# Patient Record
Sex: Male | Born: 1970 | Race: White | Hispanic: No | Marital: Married | Smoking: Current every day smoker
Health system: Southern US, Community
[De-identification: ages and names within clinical notes are randomized; demographics above are authoritative.]

## PROBLEM LIST (undated history)

## (undated) DIAGNOSIS — F419 Anxiety disorder, unspecified: Secondary | ICD-10-CM

## (undated) DIAGNOSIS — J45909 Unspecified asthma, uncomplicated: Secondary | ICD-10-CM

## (undated) DIAGNOSIS — I499 Cardiac arrhythmia, unspecified: Secondary | ICD-10-CM

## (undated) DIAGNOSIS — M109 Gout, unspecified: Secondary | ICD-10-CM

## (undated) DIAGNOSIS — R7303 Prediabetes: Secondary | ICD-10-CM

## (undated) DIAGNOSIS — I509 Heart failure, unspecified: Secondary | ICD-10-CM

## (undated) DIAGNOSIS — I82819 Embolism and thrombosis of superficial veins of unspecified lower extremities: Secondary | ICD-10-CM

## (undated) DIAGNOSIS — I4892 Unspecified atrial flutter: Secondary | ICD-10-CM

## (undated) DIAGNOSIS — Z87442 Personal history of urinary calculi: Secondary | ICD-10-CM

## (undated) DIAGNOSIS — N289 Disorder of kidney and ureter, unspecified: Secondary | ICD-10-CM

## (undated) DIAGNOSIS — Z9989 Dependence on other enabling machines and devices: Secondary | ICD-10-CM

## (undated) DIAGNOSIS — G8929 Other chronic pain: Secondary | ICD-10-CM

## (undated) DIAGNOSIS — I82409 Acute embolism and thrombosis of unspecified deep veins of unspecified lower extremity: Secondary | ICD-10-CM

## (undated) DIAGNOSIS — M549 Dorsalgia, unspecified: Secondary | ICD-10-CM

## (undated) DIAGNOSIS — J449 Chronic obstructive pulmonary disease, unspecified: Secondary | ICD-10-CM

## (undated) DIAGNOSIS — R0609 Other forms of dyspnea: Secondary | ICD-10-CM

## (undated) DIAGNOSIS — M5416 Radiculopathy, lumbar region: Secondary | ICD-10-CM

## (undated) DIAGNOSIS — G4733 Obstructive sleep apnea (adult) (pediatric): Secondary | ICD-10-CM

## (undated) DIAGNOSIS — R0600 Dyspnea, unspecified: Secondary | ICD-10-CM

## (undated) HISTORY — PX: ADENOIDECTOMY: SUR15

## (undated) HISTORY — PX: LITHOTRIPSY: SUR834

## (undated) HISTORY — PX: CYSTOSCOPY/URETEROSCOPY/HOLMIUM LASER/STENT PLACEMENT: SHX6546

## (undated) HISTORY — PX: OTHER SURGICAL HISTORY: SHX169

---

## 2005-05-26 ENCOUNTER — Ambulatory Visit: Payer: Self-pay | Admitting: Family Medicine

## 2007-06-22 ENCOUNTER — Encounter: Payer: Self-pay | Admitting: Family Medicine

## 2008-01-18 ENCOUNTER — Emergency Department (HOSPITAL_COMMUNITY): Admission: EM | Admit: 2008-01-18 | Discharge: 2008-01-18 | Payer: Self-pay | Admitting: Emergency Medicine

## 2009-08-31 ENCOUNTER — Emergency Department (HOSPITAL_COMMUNITY): Admission: EM | Admit: 2009-08-31 | Discharge: 2009-08-31 | Payer: Self-pay | Admitting: Emergency Medicine

## 2009-09-07 ENCOUNTER — Emergency Department (HOSPITAL_COMMUNITY): Admission: EM | Admit: 2009-09-07 | Discharge: 2009-09-07 | Payer: Self-pay | Admitting: Emergency Medicine

## 2010-05-15 ENCOUNTER — Emergency Department (HOSPITAL_COMMUNITY): Admission: EM | Admit: 2010-05-15 | Discharge: 2010-05-15 | Payer: Self-pay | Admitting: Emergency Medicine

## 2010-06-21 DIAGNOSIS — I4892 Unspecified atrial flutter: Secondary | ICD-10-CM

## 2010-06-21 HISTORY — DX: Unspecified atrial flutter: I48.92

## 2010-06-21 HISTORY — PX: OTHER SURGICAL HISTORY: SHX169

## 2010-07-21 NOTE — Letter (Signed)
Summary: rpc chart  rpc chart   Imported By: Curtis Sites 04/06/2010 10:37:27  _____________________________________________________________________  External Attachment:    Type:   Image     Comment:   External Document

## 2011-09-30 ENCOUNTER — Other Ambulatory Visit (HOSPITAL_COMMUNITY): Payer: Self-pay | Admitting: Family Medicine

## 2011-09-30 DIAGNOSIS — M549 Dorsalgia, unspecified: Secondary | ICD-10-CM

## 2011-10-04 ENCOUNTER — Other Ambulatory Visit (HOSPITAL_COMMUNITY): Payer: Self-pay

## 2012-01-26 ENCOUNTER — Other Ambulatory Visit (HOSPITAL_COMMUNITY): Payer: Self-pay | Admitting: Orthopedic Surgery

## 2012-01-26 ENCOUNTER — Ambulatory Visit (HOSPITAL_COMMUNITY)
Admission: RE | Admit: 2012-01-26 | Discharge: 2012-01-26 | Disposition: A | Discharge status: Home / Self Care | Payer: Medicaid Other | Source: Ambulatory Visit | Attending: Orthopedic Surgery | Admitting: Orthopedic Surgery

## 2012-01-26 DIAGNOSIS — M7989 Other specified soft tissue disorders: Secondary | ICD-10-CM | POA: Insufficient documentation

## 2012-01-26 DIAGNOSIS — M79609 Pain in unspecified limb: Secondary | ICD-10-CM | POA: Insufficient documentation

## 2012-06-24 ENCOUNTER — Encounter (HOSPITAL_COMMUNITY): Payer: Self-pay | Admitting: *Deleted

## 2012-06-24 ENCOUNTER — Emergency Department (HOSPITAL_COMMUNITY)
Admission: EM | Admit: 2012-06-24 | Discharge: 2012-06-24 | Disposition: A | Discharge status: Home / Self Care | Payer: Medicaid Other | Attending: Emergency Medicine | Admitting: Emergency Medicine

## 2012-06-24 DIAGNOSIS — Z8659 Personal history of other mental and behavioral disorders: Secondary | ICD-10-CM | POA: Insufficient documentation

## 2012-06-24 DIAGNOSIS — L0390 Cellulitis, unspecified: Secondary | ICD-10-CM

## 2012-06-24 DIAGNOSIS — Z79899 Other long term (current) drug therapy: Secondary | ICD-10-CM | POA: Insufficient documentation

## 2012-06-24 DIAGNOSIS — Z8679 Personal history of other diseases of the circulatory system: Secondary | ICD-10-CM | POA: Insufficient documentation

## 2012-06-24 DIAGNOSIS — L02219 Cutaneous abscess of trunk, unspecified: Secondary | ICD-10-CM | POA: Insufficient documentation

## 2012-06-24 DIAGNOSIS — F1210 Cannabis abuse, uncomplicated: Secondary | ICD-10-CM | POA: Insufficient documentation

## 2012-06-24 DIAGNOSIS — L0291 Cutaneous abscess, unspecified: Secondary | ICD-10-CM

## 2012-06-24 DIAGNOSIS — Z8739 Personal history of other diseases of the musculoskeletal system and connective tissue: Secondary | ICD-10-CM | POA: Insufficient documentation

## 2012-06-24 DIAGNOSIS — F17200 Nicotine dependence, unspecified, uncomplicated: Secondary | ICD-10-CM | POA: Insufficient documentation

## 2012-06-24 HISTORY — DX: Anxiety disorder, unspecified: F41.9

## 2012-06-24 HISTORY — DX: Gout, unspecified: M10.9

## 2012-06-24 HISTORY — DX: Disorder of kidney and ureter, unspecified: N28.9

## 2012-06-24 HISTORY — DX: Unspecified atrial flutter: I48.92

## 2012-06-24 MED ORDER — HYDROCODONE-ACETAMINOPHEN 5-325 MG PO TABS: 1.0000 | ORAL_TABLET | ORAL | Status: AC | PRN

## 2012-06-24 MED ORDER — SULFAMETHOXAZOLE-TRIMETHOPRIM 800-160 MG PO TABS: 1.0000 | ORAL_TABLET | Freq: Two times a day (BID) | ORAL | Status: DC

## 2012-06-24 NOTE — ED Provider Notes (Signed)
History     CSN: [id number]  Arrival date & time 06/24/12  1532   First MD Initiated Contact with Patient 06/24/12 1736      Chief Complaint  Patient presents with  . Abscess    (Consider location/radiation/quality/duration/timing/severity/associated sxs/prior treatment) HPI Comments: Darrell Lindsey is a 42 y.o. Male presents with multiple small areas of skin infection and one large boil on his abdomen and groin area which has burst and drained a large amount of pus.  He reports recent exposure to a family member with known mrsa.  He denies fevers and chills and other complaint,  Except for localized pain and continued drainage from the one boil site.     The history is provided by the patient.    Past Medical History  Diagnosis Date  . Anxiety   . Back pain   . Renal disorder   . Gout   . Atrial flutter     Past Surgical History  Procedure Date  . Cardaic ablasion     No family history on file.  History  Substance Use Topics  . Smoking status: Current Every Day Smoker  . Smokeless tobacco: Not on file  . Alcohol Use: Yes     Comment: rarely      Review of Systems  Constitutional: Negative for fever and chills.  HENT: Negative for facial swelling.   Respiratory: Negative for shortness of breath and wheezing.   Skin: Positive for color change.  Neurological: Negative for numbness.    Allergies  Effexor  Home Medications   Current Outpatient Rx  Name  Route  Sig  Dispense  Refill  . HYDROCODONE-ACETAMINOPHEN 5-325 MG PO TABS   Oral   Take 1 tablet by mouth every 4 (four) hours as needed for pain.   15 tablet   0   . SULFAMETHOXAZOLE-TRIMETHOPRIM 800-160 MG PO TABS   Oral   Take 1 tablet by mouth every 12 (twelve) hours.   28 tablet   0     BP 129/90  Pulse 84  Temp 97.9 F (36.6 C) (Oral)  Resp 16  Ht 6' (1.829 m)  Wt 315 lb (142.883 kg)  BMI 42.72 kg/m2  SpO2 99%  Physical Exam  Constitutional: He appears well-developed and  well-nourished. No distress.  HENT:  Head: Normocephalic.  Neck: Neck supple.  Cardiovascular: Normal rate.   Pulmonary/Chest: Effort normal. He has no wheezes.  Musculoskeletal: Normal range of motion. He exhibits no edema.  Skin:       Several small scattered papules along anterior belt line area of abdomen.  He has a shallow open abscess on suprapubic area without significant surrounding edema,  But with 3 cm halo of erythema.  Shallow base of abscess with healthy appearing granulation noted at base.  No active drainage with palpation.    ED Course  Procedures (including critical care time)  Labs Reviewed - No data to display No results found.   1. Abscess and cellulitis       MDM  Encouraged warm compresses or tub soaks,  Antimicrobial soap.  Prescribed bactrim,  Hydrocodone.  F/u care prn.  No systemic sx,  Patient appears well.        Burgess Amor, PA 06/25/12 1302

## 2012-06-24 NOTE — ED Notes (Signed)
Pt states abscess to suprapubic area. Draining x 2 days. NAD.

## 2012-06-27 NOTE — ED Provider Notes (Signed)
Medical screening examination/treatment/procedure(s) were performed by non-physician practitioner and as supervising physician I was immediately available for consultation/collaboration.   Shelda Jakes, MD 06/27/12 [phone]

## 2012-08-31 ENCOUNTER — Telehealth: Payer: Self-pay | Admitting: Gastroenterology

## 2012-08-31 ENCOUNTER — Ambulatory Visit: Payer: Medicaid Other | Admitting: Gastroenterology

## 2012-08-31 NOTE — Telephone Encounter (Signed)
Pt was a no show

## 2013-10-16 ENCOUNTER — Emergency Department (HOSPITAL_COMMUNITY)
Admission: EM | Admit: 2013-10-16 | Discharge: 2013-10-16 | Disposition: A | Discharge status: Home / Self Care | Payer: Medicaid Other | Attending: Emergency Medicine | Admitting: Emergency Medicine

## 2013-10-16 ENCOUNTER — Encounter (HOSPITAL_COMMUNITY): Payer: Self-pay | Admitting: Emergency Medicine

## 2013-10-16 DIAGNOSIS — M109 Gout, unspecified: Secondary | ICD-10-CM | POA: Insufficient documentation

## 2013-10-16 DIAGNOSIS — Z79899 Other long term (current) drug therapy: Secondary | ICD-10-CM | POA: Insufficient documentation

## 2013-10-16 DIAGNOSIS — F411 Generalized anxiety disorder: Secondary | ICD-10-CM | POA: Insufficient documentation

## 2013-10-16 DIAGNOSIS — I4892 Unspecified atrial flutter: Secondary | ICD-10-CM | POA: Insufficient documentation

## 2013-10-16 DIAGNOSIS — Z8739 Personal history of other diseases of the musculoskeletal system and connective tissue: Secondary | ICD-10-CM | POA: Insufficient documentation

## 2013-10-16 DIAGNOSIS — L0291 Cutaneous abscess, unspecified: Secondary | ICD-10-CM

## 2013-10-16 DIAGNOSIS — E669 Obesity, unspecified: Secondary | ICD-10-CM | POA: Insufficient documentation

## 2013-10-16 DIAGNOSIS — K612 Anorectal abscess: Secondary | ICD-10-CM | POA: Insufficient documentation

## 2013-10-16 DIAGNOSIS — F17200 Nicotine dependence, unspecified, uncomplicated: Secondary | ICD-10-CM | POA: Insufficient documentation

## 2013-10-16 DIAGNOSIS — Z87448 Personal history of other diseases of urinary system: Secondary | ICD-10-CM | POA: Insufficient documentation

## 2013-10-16 DIAGNOSIS — Z7982 Long term (current) use of aspirin: Secondary | ICD-10-CM | POA: Insufficient documentation

## 2013-10-16 MED ORDER — DOXYCYCLINE HYCLATE 100 MG PO CAPS: 100.0000 mg | ORAL_CAPSULE | Freq: Two times a day (BID) | ORAL | Status: DC

## 2013-10-16 MED ORDER — LIDOCAINE-EPINEPHRINE-TETRACAINE (LET) SOLUTION
3.0000 mL | Freq: Once | NASAL | Status: AC
  Administered 2013-10-16: 3 mL via TOPICAL
  Filled 2013-10-16: qty 3

## 2013-10-16 MED ORDER — OXYCODONE-ACETAMINOPHEN 5-325 MG PO TABS
1.0000 | ORAL_TABLET | Freq: Once | ORAL | Status: AC
  Administered 2013-10-16: 1 via ORAL
  Filled 2013-10-16: qty 1

## 2013-10-16 MED ORDER — LIDOCAINE HCL (PF) 1 % IJ SOLN
5.0000 mL | Freq: Once | INTRAMUSCULAR | Status: AC
  Administered 2013-10-16: 5 mL via INTRADERMAL
  Filled 2013-10-16: qty 5

## 2013-10-16 MED ORDER — OXYCODONE-ACETAMINOPHEN 5-325 MG PO TABS
1.0000 | ORAL_TABLET | ORAL | Status: DC | PRN
Start: 2013-10-16 — End: 2014-07-29

## 2013-10-16 NOTE — Discharge Instructions (Signed)

## 2013-10-16 NOTE — ED Provider Notes (Signed)
CSN: [id number]     Arrival date & time 10/16/13  1508 History   First MD Initiated Contact with Patient 10/16/13 1541     Chief Complaint  Patient presents with  . Abscess     (Consider location/radiation/quality/duration/timing/severity/associated sxs/prior Treatment) Patient is a [age] y.o. male presenting with abscess. The history is provided by the patient.  Abscess Location:  Ano-genital Ano-genital abscess location:  Perineum Abscess quality: fluctuance, induration, painful and redness   Abscess quality: not draining and no warmth   Red streaking: no   Duration:  2 days Progression:  Worsening Pain details:    Quality:  Throbbing   Severity:  Moderate   Timing:  Constant   Progression:  Worsening Chronicity:  Recurrent Context: not diabetes and not insect bite/sting   Relieved by:  Nothing Worsened by:  Nothing tried Ineffective treatments:  None tried Associated symptoms: no fever, no nausea and no vomiting   Risk factors: no hx of MRSA     Past Medical History  Diagnosis Date  . Anxiety   . Back pain   . Gout   . Atrial flutter   . Renal disorder    Past Surgical History  Procedure Laterality Date  . Cardaic ablasion    . Adenoidectomy    . Removal of salivary gland      History reviewed. No pertinent family history. History  Substance Use Topics  . Smoking status: Current Every Day Smoker  . Smokeless tobacco: Not on file  . Alcohol Use: Yes     Comment: rarely    Review of Systems  Constitutional: Negative for fever and chills.  Gastrointestinal: Negative for nausea and vomiting.  Musculoskeletal: Negative for arthralgias and joint swelling.  Skin: Positive for color change.       Abscess   Hematological: Negative for adenopathy.  All other systems reviewed and are negative.     Allergies  Effexor  Home Medications   Prior to Admission medications   Medication Sig Start Date End Date Taking? Authorizing Provider  albuterol (PROAIR  HFA) 108 (90 BASE) MCG/ACT inhaler Inhale 1-2 puffs into the lungs every 6 (six) hours as needed for wheezing or shortness of breath.   Yes Historical Provider, MD  allopurinol (ZYLOPRIM) 300 MG tablet Take 300 mg by mouth daily.   Yes Historical Provider, MD  aspirin EC 325 MG tablet Take 325 mg by mouth daily.   Yes Historical Provider, MD  atorvastatin (LIPITOR) 20 MG tablet Take 20 mg by mouth daily.   Yes Historical Provider, MD  clonazePAM (KLONOPIN) 0.5 MG tablet Take 0.5 mg by mouth 2 (two) times daily as needed for anxiety.   Yes Historical Provider, MD  fluticasone (FLONASE) 50 MCG/ACT nasal spray Place 1 spray into both nostrils daily as needed for allergies or rhinitis.   Yes Historical Provider, MD  ibuprofen (ADVIL,MOTRIN) 200 MG tablet Take 800 mg by mouth daily as needed for mild pain or moderate pain.   Yes Historical Provider, MD  lisinopril (PRINIVIL,ZESTRIL) 10 MG tablet Take 10 mg by mouth daily.   Yes Historical Provider, MD  Menthol, Topical Analgesic, (ICY HOT) 5 % PADS Apply 1 patch topically once as needed (for back pain).   Yes Historical Provider, MD  metoprolol succinate (TOPROL-XL) 50 MG 24 hr tablet Take 75 mg by mouth daily. Take with or immediately following a meal.   Yes Historical Provider, MD  nitroGLYCERIN (NITROSTAT) 0.4 MG SL tablet Place 0.4 mg under the tongue every  5 (five) minutes as needed for chest pain.   Yes Historical Provider, MD  traMADol (ULTRAM) 50 MG tablet Take 100 mg by mouth 2 (two) times daily as needed for moderate pain or severe pain.   Yes Historical Provider, MD   BP 155/96  Pulse 64  Temp(Src) 97.8 F (36.6 C) (Oral)  Resp 18  Ht 6' (1.829 m)  Wt 345 lb (156.491 kg)  BMI 46.78 kg/m2  SpO2 100% Physical Exam  Nursing note and vitals reviewed. Constitutional: He is oriented to person, place, and time. He appears well-developed and well-nourished. No distress.  Patient is obese  HENT:  Head: Normocephalic and atraumatic.   Cardiovascular: Normal rate, regular rhythm and normal heart sounds.   No murmur heard. Pulmonary/Chest: Effort normal and breath sounds normal. No respiratory distress.  Neurological: He is alert and oriented to person, place, and time. He exhibits normal muscle tone. Coordination normal.  Skin: Skin is warm and dry. There is erythema.  Abscess to the suprapubic region with moderate fluctuance and surrounding erythema.  No red streaks or drainage    ED Course  Procedures (including critical care time) Labs Review Labs Reviewed - No data to display  Imaging Review No results found.   EKG Interpretation None       INCISION AND DRAINAGE Performed by: Kahmya Pinkham L. Glory Graefe Consent: Verbal consent obtained. Risks and benefits: risks, benefits and alternatives were discussed Type: abscess  Body area: suprapubic region  Anesthesia: local infiltration  Incision was made with a #11 scalpel.  Local anesthetic: lidocaine 1% w/o epinephrine  Anesthetic total: 3 ml  Complexity: complex Blunt dissection to break up loculations  Drainage: purulent  Drainage amount: large  Packing material: 1/4 in iodoform gauze  Patient tolerance: Patient tolerated the procedure well with no immediate complications.     MDM   Final diagnoses:  Abscess    Pt is well appearing, non-toxic.  VSS.  He agrees to return here in 2 days for recheck and packing removal.  rx for doxy and percocet.  Pt appears stable for d/c     Jaylyn Booher L. Trisha Mangleriplett, PA-C 10/18/13 2002

## 2013-10-16 NOTE — ED Notes (Signed)
Pt states he had small bump to perineal area but that over last couple of says, has become more painful. Perineum cellulitic in appearance surrounding original "bump".

## 2013-10-16 NOTE — ED Notes (Signed)
Abscess "in my private area"

## 2013-10-19 NOTE — ED Provider Notes (Signed)
Medical screening examination/treatment/procedure(s) were performed by non-physician practitioner and as supervising physician I was immediately available for consultation/collaboration.    Brantley Naser R Alyda Megna, MD 10/19/13 1543 

## 2014-04-18 ENCOUNTER — Other Ambulatory Visit: Payer: Self-pay | Admitting: Urology

## 2014-04-18 DIAGNOSIS — N2889 Other specified disorders of kidney and ureter: Secondary | ICD-10-CM

## 2014-04-25 ENCOUNTER — Ambulatory Visit
Admission: RE | Admit: 2014-04-25 | Discharge: 2014-04-25 | Disposition: A | Discharge status: Home / Self Care | Payer: Medicaid Other | Source: Ambulatory Visit | Attending: Urology | Admitting: Urology

## 2014-04-25 DIAGNOSIS — N2889 Other specified disorders of kidney and ureter: Secondary | ICD-10-CM

## 2014-04-25 MED ORDER — GADOBENATE DIMEGLUMINE 529 MG/ML IV SOLN
20.0000 mL | Freq: Once | INTRAVENOUS | Status: AC | PRN
Start: 2014-04-25 — End: 2014-04-25

## 2014-07-29 ENCOUNTER — Emergency Department (HOSPITAL_COMMUNITY)
Admission: EM | Admit: 2014-07-29 | Discharge: 2014-07-29 | Disposition: A | Discharge status: Home / Self Care | Payer: Medicaid Other | Attending: Emergency Medicine | Admitting: Emergency Medicine

## 2014-07-29 ENCOUNTER — Ambulatory Visit (HOSPITAL_COMMUNITY): Admit: 2014-07-29 | Payer: Medicaid Other

## 2014-07-29 ENCOUNTER — Emergency Department (HOSPITAL_COMMUNITY): Payer: Medicaid Other

## 2014-07-29 ENCOUNTER — Encounter (HOSPITAL_COMMUNITY): Payer: Self-pay | Admitting: *Deleted

## 2014-07-29 DIAGNOSIS — T798XXA Other early complications of trauma, initial encounter: Secondary | ICD-10-CM

## 2014-07-29 DIAGNOSIS — Y838 Other surgical procedures as the cause of abnormal reaction of the patient, or of later complication, without mention of misadventure at the time of the procedure: Secondary | ICD-10-CM | POA: Diagnosis not present

## 2014-07-29 DIAGNOSIS — M79661 Pain in right lower leg: Secondary | ICD-10-CM

## 2014-07-29 DIAGNOSIS — Z7982 Long term (current) use of aspirin: Secondary | ICD-10-CM | POA: Insufficient documentation

## 2014-07-29 DIAGNOSIS — Z79899 Other long term (current) drug therapy: Secondary | ICD-10-CM | POA: Insufficient documentation

## 2014-07-29 DIAGNOSIS — J449 Chronic obstructive pulmonary disease, unspecified: Secondary | ICD-10-CM | POA: Diagnosis not present

## 2014-07-29 DIAGNOSIS — G4733 Obstructive sleep apnea (adult) (pediatric): Secondary | ICD-10-CM | POA: Diagnosis not present

## 2014-07-29 DIAGNOSIS — Z720 Tobacco use: Secondary | ICD-10-CM | POA: Diagnosis not present

## 2014-07-29 DIAGNOSIS — F419 Anxiety disorder, unspecified: Secondary | ICD-10-CM | POA: Insufficient documentation

## 2014-07-29 DIAGNOSIS — Z9981 Dependence on supplemental oxygen: Secondary | ICD-10-CM | POA: Diagnosis not present

## 2014-07-29 DIAGNOSIS — M7989 Other specified soft tissue disorders: Secondary | ICD-10-CM

## 2014-07-29 DIAGNOSIS — Z7951 Long term (current) use of inhaled steroids: Secondary | ICD-10-CM | POA: Diagnosis not present

## 2014-07-29 DIAGNOSIS — T814XXA Infection following a procedure, initial encounter: Secondary | ICD-10-CM | POA: Diagnosis not present

## 2014-07-29 DIAGNOSIS — Z792 Long term (current) use of antibiotics: Secondary | ICD-10-CM | POA: Diagnosis not present

## 2014-07-29 DIAGNOSIS — G8929 Other chronic pain: Secondary | ICD-10-CM | POA: Insufficient documentation

## 2014-07-29 DIAGNOSIS — Z87448 Personal history of other diseases of urinary system: Secondary | ICD-10-CM | POA: Diagnosis not present

## 2014-07-29 DIAGNOSIS — M109 Gout, unspecified: Secondary | ICD-10-CM | POA: Diagnosis not present

## 2014-07-29 HISTORY — DX: Dorsalgia, unspecified: M54.9

## 2014-07-29 HISTORY — DX: Chronic obstructive pulmonary disease, unspecified: J44.9

## 2014-07-29 HISTORY — DX: Obstructive sleep apnea (adult) (pediatric): G47.33

## 2014-07-29 HISTORY — DX: Other forms of dyspnea: R06.09

## 2014-07-29 HISTORY — DX: Radiculopathy, lumbar region: M54.16

## 2014-07-29 HISTORY — DX: Unspecified asthma, uncomplicated: J45.909

## 2014-07-29 HISTORY — DX: Other chronic pain: G89.29

## 2014-07-29 HISTORY — DX: Dependence on other enabling machines and devices: Z99.89

## 2014-07-29 HISTORY — DX: Dyspnea, unspecified: R06.00

## 2014-07-29 LAB — COMPREHENSIVE METABOLIC PANEL
ALBUMIN: 3.9 g/dL (ref 3.5–5.2)
ALK PHOS: 130 U/L — AB (ref 39–117)
ALT: 20 U/L (ref 0–53)
AST: 22 U/L (ref 0–37)
Anion gap: 5 (ref 5–15)
BUN: 15 mg/dL (ref 6–23)
CO2: 26 mmol/L (ref 19–32)
CREATININE: 1.11 mg/dL (ref 0.50–1.35)
Calcium: 9 mg/dL (ref 8.4–10.5)
Chloride: 112 mmol/L (ref 96–112)
GFR calc non Af Amer: 81 mL/min — ABNORMAL LOW (ref >90)
GLUCOSE: 88 mg/dL (ref 70–99)
Potassium: 3.7 mmol/L (ref 3.5–5.1)
SODIUM: 143 mmol/L (ref 135–145)
TOTAL PROTEIN: 6.9 g/dL (ref 6.0–8.3)
Total Bilirubin: 0.7 mg/dL (ref 0.3–1.2)

## 2014-07-29 LAB — CBC WITH DIFFERENTIAL/PLATELET
BASOS PCT: 1 % (ref 0–1)
Basophils Absolute: 0.1 10*3/uL (ref 0.0–0.1)
EOS ABS: 0.2 10*3/uL (ref 0.0–0.7)
EOS PCT: 2 % (ref 0–5)
HEMATOCRIT: 45.6 % (ref 39.0–52.0)
Hemoglobin: 15.5 g/dL (ref 13.0–17.0)
LYMPHS PCT: 29 % (ref 12–46)
Lymphs Abs: 3.3 10*3/uL (ref 0.7–4.0)
MCH: 30.9 pg (ref 26.0–34.0)
MCHC: 34 g/dL (ref 30.0–36.0)
MCV: 91 fL (ref 78.0–100.0)
MONO ABS: 0.9 10*3/uL (ref 0.1–1.0)
MONOS PCT: 8 % (ref 3–12)
NEUTROS ABS: 7 10*3/uL (ref 1.7–7.7)
Neutrophils Relative %: 60 % (ref 43–77)
Platelets: 218 10*3/uL (ref 150–400)
RBC: 5.01 MIL/uL (ref 4.22–5.81)
RDW: 13.3 % (ref 11.5–15.5)
WBC: 11.5 10*3/uL — ABNORMAL HIGH (ref 4.0–10.5)

## 2014-07-29 MED ORDER — OXYCODONE-ACETAMINOPHEN 5-325 MG PO TABS: 1.0000 | ORAL_TABLET | ORAL | Status: DC | PRN

## 2014-07-29 MED ORDER — CLINDAMYCIN HCL 300 MG PO CAPS: 300.0000 mg | ORAL_CAPSULE | Freq: Four times a day (QID) | ORAL | Status: DC

## 2014-07-29 MED ORDER — RIVAROXABAN 15 MG PO TABS
15.0000 mg | ORAL_TABLET | Freq: Once | ORAL | Status: AC
  Administered 2014-07-29: 15 mg via ORAL
  Filled 2014-07-29: qty 1

## 2014-07-29 MED ORDER — CLINDAMYCIN HCL 150 MG PO CAPS
300.0000 mg | ORAL_CAPSULE | Freq: Once | ORAL | Status: AC
  Administered 2014-07-29: 300 mg via ORAL
  Filled 2014-07-29: qty 2

## 2014-07-29 MED ORDER — RIVAROXABAN 15 MG PO TABS
ORAL_TABLET | ORAL | Status: AC
  Filled 2014-07-29: qty 1

## 2014-07-29 NOTE — ED Notes (Signed)
Pain, swelling of rt lower leg with redness of great toe. [facility] and ? Infection of rt great toe.

## 2014-07-29 NOTE — ED Notes (Signed)
AC notified of need for Xarelto from pharmacy.

## 2014-07-29 NOTE — Discharge Instructions (Signed)
Elevate your legs above your heart, as much as possible. Use a warm compress on the sore area, your right great toe, 3 times a day for 30 minutes. Start the antibiotic prescription in the morning. Return here tomorrow morning, for ultrasound of the right leg, to be evaluated for a DVT.   Wound Infection A wound infection happens when a type of germ (bacteria) grows in a wound. Caring for the infection can help the wound heal. Wound infections need treatment. HOME CARE   Only take medicine as told by your doctor.  Take your antibiotic medicine as told. Finish it even if you start to feel better.  Clean the wound with mild soap and water as told. Rinse the soap off. Pat the area dry with a clean towel. Do not rub the wound.  Change any bandages (dressings) as told by your doctor.  Put cream and a bandage on the wound as told by your doctor.  If the bandage sticks, wet it with soapy water to remove the bandage.  Change the bandage if it gets wet, dirty, or starts to smell.  Take showers. Do not take baths, swim, or do anything that puts your wound under water.  Avoid exercise that makes you sweat.  If your wound itches, use a medicine that helps stop itching. Do not pick or scratch at the wound.  Keep all doctor visits as told. GET HELP RIGHT AWAY IF:   You have more puffiness (swelling), pain, or redness around the wound.  You have more yellowish-white fluid (pus) coming from the wound.  You have a bad smell coming from the wound.  Your wound breaks open more.  You have a fever. MAKE SURE YOU:   Understand these instructions.  Will watch your condition.  Will get help right away if you are not doing well or get worse. Document Released: 03/16/2008 Document Revised: 08/30/2011 Document Reviewed: 11/16/2010 [facility] Patient Information 2015 [place]. This information is not intended to replace advice given to you by your health care provider. Make sure you discuss  any questions you have with your health care provider.

## 2014-07-29 NOTE — ED Provider Notes (Signed)
CSN: [id number]     Arrival date & time 07/29/14  1817 History   First MD Initiated Contact with Patient 07/29/14 2203     Chief Complaint  Patient presents with  . Leg Pain     (Consider location/radiation/quality/duration/timing/severity/associated sxs/prior Treatment) HPI   Darrell Lindsey is a 44 y.o. male who presents for evaluation of gradual onset right great toe swelling with redness, extending into the right lower leg over the last 5 days.  He had partial toenail removal 1 week ago, right great toe.  He denies fever, chills, nausea, vomiting, weakness or dizziness.  The pain and swelling in his right leg are worse with ambulation, and improved with resting.  He has not had this previously.  He saw Dr. today that was concerned about a bone infection or blood clot, so suggested he come here.  He is taking his usual location.  There are no other known modifying factors.   Past Medical History  Diagnosis Date  . Anxiety   . Gout   . Atrial flutter 2012    Tx with ablation  . Renal disorder   . Chronic back pain   . Lumbar radiculopathy   . COPD (chronic obstructive pulmonary disease)   . Asthma   . OSA on CPAP   . DOE (dyspnea on exertion)     chronic   Past Surgical History  Procedure Laterality Date  . Cardaic ablasion    . Adenoidectomy    . Removal of salivary gland      History reviewed. No pertinent family history. History  Substance Use Topics  . Smoking status: Current Every Day Smoker    Types: Cigarettes  . Smokeless tobacco: Not on file  . Alcohol Use: Yes     Comment: rarely    Review of Systems  All other systems reviewed and are negative.     Allergies  Effexor  Home Medications   Prior to Admission medications   Medication Sig Start Date End Date Taking? Authorizing Provider  albuterol (PROAIR HFA) 108 (90 BASE) MCG/ACT inhaler Inhale 1-2 puffs into the lungs every 6 (six) hours as needed for wheezing or shortness of breath.   Yes  Historical Provider, MD  allopurinol (ZYLOPRIM) 300 MG tablet Take 300 mg by mouth at bedtime.    Yes Historical Provider, MD  aspirin EC 325 MG tablet Take 325 mg by mouth daily.   Yes Historical Provider, MD  atorvastatin (LIPITOR) 20 MG tablet Take 20 mg by mouth daily.   Yes Historical Provider, MD  clonazePAM (KLONOPIN) 0.5 MG tablet Take 0.5 mg by mouth 3 (three) times daily as needed for anxiety.    Yes Historical Provider, MD  fluticasone (FLONASE) 50 MCG/ACT nasal spray Place 1 spray into both nostrils daily as needed for allergies or rhinitis.   Yes Historical Provider, MD  Fluticasone-Salmeterol (ADVAIR) 250-50 MCG/DOSE AEPB Inhale 1 puff into the lungs 2 (two) times daily.   Yes Historical Provider, MD  ibuprofen (ADVIL,MOTRIN) 200 MG tablet Take 200 mg by mouth every 6 (six) hours as needed for mild pain or moderate pain.    Yes Historical Provider, MD  ipratropium-albuterol (DUONEB) 0.5-2.5 (3) MG/3ML SOLN Take 3 mLs by nebulization 4 (four) times daily.   Yes Historical Provider, MD  lisinopril (PRINIVIL,ZESTRIL) 10 MG tablet Take 10 mg by mouth every evening.    Yes Historical Provider, MD  metoprolol succinate (TOPROL-XL) 50 MG 24 hr tablet Take 75 mg by mouth daily. Take  with or immediately following a meal.   Yes Historical Provider, MD  nitroGLYCERIN (NITROSTAT) 0.4 MG SL tablet Place 0.4 mg under the tongue every 5 (five) minutes as needed for chest pain.   Yes Historical Provider, MD  tamsulosin (FLOMAX) 0.4 MG CAPS capsule Take 0.4 mg by mouth daily after supper.   Yes Historical Provider, MD  traMADol (ULTRAM) 50 MG tablet Take 100 mg by mouth every 4 (four) hours as needed for moderate pain or severe pain.    Yes Historical Provider, MD  clindamycin (CLEOCIN) 300 MG capsule Take 1 capsule (300 mg total) by mouth 4 (four) times daily. X 7 days 07/29/14   Flint MelterElliott L Chivon Lepage, MD   BP 119/77 mmHg  Pulse 67  Temp(Src) 97.8 F (36.6 C) (Oral)  Resp 18  Ht 6' (1.829 m)  Wt 345 lb  (156.491 kg)  BMI 46.78 kg/m2  SpO2 94% Physical Exam  Constitutional: He is oriented to person, place, and time. He appears well-developed and well-nourished.  HENT:  Head: Normocephalic and atraumatic.  Right Ear: External ear normal.  Left Ear: External ear normal.  Eyes: Conjunctivae and EOM are normal. Pupils are equal, round, and reactive to light.  Neck: Normal range of motion and phonation normal. Neck supple.  Cardiovascular: Normal rate, regular rhythm and normal heart sounds.   Pulmonary/Chest: Effort normal and breath sounds normal. He exhibits no bony tenderness.  Abdominal: Soft. There is no tenderness.  Musculoskeletal:  Mild right cast swelling as compared to left.  Tender right lateral foot and right great toe.  Right great toe is injected, in the paronychia with mild swelling but no fluctuance.  There is no streaking from the toe across the foot.  Neurological: He is alert and oriented to person, place, and time. No cranial nerve deficit or sensory deficit. He exhibits normal muscle tone. Coordination normal.  Skin: Skin is warm, dry and intact.  Psychiatric: He has a normal mood and affect. His behavior is normal. Judgment and thought content normal.  Nursing note and vitals reviewed.   ED Course  Procedures (including critical care time)  Medications  clindamycin (CLEOCIN) capsule 300 mg (not administered)  Rivaroxaban (XARELTO) tablet 15 mg (not administered)    Patient Vitals for the past 24 hrs:  BP Temp Temp src Pulse Resp SpO2 Height Weight  07/29/14 2052 119/77 mmHg - - 67 18 94 % - -  07/29/14 1827 131/72 mmHg 97.8 F (36.6 C) Oral 63 22 99 % 6' (1.829 m) (!) 345 lb (156.491 kg)    10:51 PM Reevaluation with update and discussion. After initial assessment and treatment, an updated evaluation reveals no additional complaints, findings discussed with patient, all questions answered. Conrad Zajkowski L   Patient set up for ultrasound, right leg, tomorrow to  be evaluated for DVT.  Labs Review Labs Reviewed  CBC WITH DIFFERENTIAL/PLATELET - Abnormal; Notable for the following:    WBC 11.5 (*)    All other components within normal limits  COMPREHENSIVE METABOLIC PANEL - Abnormal; Notable for the following:    Alkaline Phosphatase 130 (*)    GFR calc non Af Amer 81 (*)    All other components within normal limits    Imaging Review Dg Toe Great Right  07/29/2014   CLINICAL DATA:  Acute onset of right great toe pain and swelling. Recent removal of ingrown right great toenail. Initial encounter.  EXAM: RIGHT GREAT TOE  COMPARISON:  None.  FINDINGS: There is no evidence of fracture  or dislocation. Visualized joint spaces are preserved. No osseous erosion is seen to suggest osteomyelitis, although evaluation for osteomyelitis is limited on radiograph. The nailbed is not well characterized on radiograph. No radiopaque foreign bodies are seen.  A bipartite medial sesamoid of the first toe is noted.  IMPRESSION: 1. No evidence of fracture or dislocation.  No osseous erosion seen. 2. Bipartite medial sesamoid of the first toe.   Electronically Signed   By: Roanna Raider M.D.   On: 07/29/2014 22:41     EKG Interpretation None      MDM   Final diagnoses:  Wound infection, initial encounter  Pain and swelling of lower leg, right    Wound infection, right great toe, with unrelated right lower leg swelling.  I doubt significant cellulitis, PE, metabolic instability or impending vascular collapse.  Nursing Notes Reviewed/ Care Coordinated Applicable Imaging Reviewed Interpretation of Laboratory Data incorporated into ED treatment  The patient appears reasonably screened and/or stabilized for discharge and I doubt any other medical condition or other [facility] requiring further screening, evaluation, or treatment in the ED at this time prior to discharge.  Plan: Home Medications- clindamycin; Home Treatments- wound care, elevation, legs; return here if the  recommended treatment, does not improve the symptoms; Recommended follow up- return tomorrow for ultrasound, follow-up with podiatrist, ongoing management of the wound infection.   Flint Melter, MD 07/29/14 2252

## 2014-07-30 ENCOUNTER — Ambulatory Visit (HOSPITAL_COMMUNITY)
Admit: 2014-07-30 | Discharge: 2014-07-30 | Disposition: A | Discharge status: Home / Self Care | Payer: Medicaid Other | Attending: Emergency Medicine | Admitting: Emergency Medicine

## 2014-07-30 ENCOUNTER — Other Ambulatory Visit (HOSPITAL_COMMUNITY): Payer: Self-pay | Admitting: Emergency Medicine

## 2014-07-30 DIAGNOSIS — M7989 Other specified soft tissue disorders: Secondary | ICD-10-CM

## 2014-07-30 NOTE — ED Provider Notes (Signed)
Patient returns today for scheduled outpatient venous duplex. Results have been reviewed and are negative for DVT. I did discuss the results with the patient. He understands that the previous plan given to him by Dr. Effie ShyWentz, including antibiotics, wound therapy and follow-up with podiatry is recommended. He is in agreement with this plan and has no questions.  Gilda Creasehristopher J. Erricka Falkner, MD 07/30/14 [phone]

## 2014-08-05 MED FILL — Oxycodone w/ Acetaminophen Tab 5-325 MG: ORAL | Qty: 6 | Status: AC

## 2015-04-07 ENCOUNTER — Emergency Department (HOSPITAL_COMMUNITY): Payer: Medicaid Other

## 2015-04-07 ENCOUNTER — Encounter (HOSPITAL_COMMUNITY): Payer: Self-pay | Admitting: Emergency Medicine

## 2015-04-07 ENCOUNTER — Emergency Department (HOSPITAL_COMMUNITY)
Admission: EM | Admit: 2015-04-07 | Discharge: 2015-04-07 | Disposition: A | Discharge status: Home / Self Care | Payer: Medicaid Other | Attending: Emergency Medicine | Admitting: Emergency Medicine

## 2015-04-07 DIAGNOSIS — M25562 Pain in left knee: Secondary | ICD-10-CM | POA: Insufficient documentation

## 2015-04-07 DIAGNOSIS — Z720 Tobacco use: Secondary | ICD-10-CM | POA: Diagnosis not present

## 2015-04-07 DIAGNOSIS — Z7951 Long term (current) use of inhaled steroids: Secondary | ICD-10-CM | POA: Diagnosis not present

## 2015-04-07 DIAGNOSIS — G8929 Other chronic pain: Secondary | ICD-10-CM | POA: Insufficient documentation

## 2015-04-07 DIAGNOSIS — G4733 Obstructive sleep apnea (adult) (pediatric): Secondary | ICD-10-CM | POA: Insufficient documentation

## 2015-04-07 DIAGNOSIS — Z7982 Long term (current) use of aspirin: Secondary | ICD-10-CM | POA: Insufficient documentation

## 2015-04-07 DIAGNOSIS — Z87448 Personal history of other diseases of urinary system: Secondary | ICD-10-CM | POA: Insufficient documentation

## 2015-04-07 DIAGNOSIS — F419 Anxiety disorder, unspecified: Secondary | ICD-10-CM | POA: Insufficient documentation

## 2015-04-07 DIAGNOSIS — Z79899 Other long term (current) drug therapy: Secondary | ICD-10-CM | POA: Insufficient documentation

## 2015-04-07 DIAGNOSIS — M109 Gout, unspecified: Secondary | ICD-10-CM | POA: Diagnosis not present

## 2015-04-07 DIAGNOSIS — R079 Chest pain, unspecified: Secondary | ICD-10-CM | POA: Diagnosis not present

## 2015-04-07 DIAGNOSIS — J441 Chronic obstructive pulmonary disease with (acute) exacerbation: Secondary | ICD-10-CM | POA: Insufficient documentation

## 2015-04-07 DIAGNOSIS — M7989 Other specified soft tissue disorders: Secondary | ICD-10-CM | POA: Diagnosis present

## 2015-04-07 LAB — BASIC METABOLIC PANEL
ANION GAP: 7 (ref 5–15)
BUN: 15 mg/dL (ref 6–20)
CALCIUM: 8.8 mg/dL — AB (ref 8.9–10.3)
CHLORIDE: 107 mmol/L (ref 101–111)
CO2: 27 mmol/L (ref 22–32)
Creatinine, Ser: 1.34 mg/dL — ABNORMAL HIGH (ref 0.61–1.24)
GLUCOSE: 102 mg/dL — AB (ref 65–99)
Potassium: 4.3 mmol/L (ref 3.5–5.1)
Sodium: 141 mmol/L (ref 135–145)

## 2015-04-07 LAB — SYNOVIAL CELL COUNT + DIFF, W/ CRYSTALS
CRYSTALS FLUID: NONE SEEN
Eosinophils-Synovial: 0 % (ref 0–1)
LYMPHOCYTES-SYNOVIAL FLD: 5 % (ref 0–20)
Monocyte-Macrophage-Synovial Fluid: 3 % — ABNORMAL LOW (ref 50–90)
NEUTROPHIL, SYNOVIAL: 92 % — AB (ref 0–25)
WBC, SYNOVIAL: 11278 /mm3 — AB (ref 0–200)

## 2015-04-07 LAB — SEDIMENTATION RATE: SED RATE: 11 mm/h (ref 0–16)

## 2015-04-07 LAB — CBC
HEMATOCRIT: 44.1 % (ref 39.0–52.0)
HEMOGLOBIN: 14.9 g/dL (ref 13.0–17.0)
MCH: 30.8 pg (ref 26.0–34.0)
MCHC: 33.8 g/dL (ref 30.0–36.0)
MCV: 91.1 fL (ref 78.0–100.0)
Platelets: 196 10*3/uL (ref 150–400)
RBC: 4.84 MIL/uL (ref 4.22–5.81)
RDW: 13 % (ref 11.5–15.5)
WBC: 12.2 10*3/uL — ABNORMAL HIGH (ref 4.0–10.5)

## 2015-04-07 LAB — PROTIME-INR
INR: 0.99 (ref 0.00–1.49)
PROTHROMBIN TIME: 13.3 s (ref 11.6–15.2)

## 2015-04-07 LAB — GRAM STAIN

## 2015-04-07 LAB — BRAIN NATRIURETIC PEPTIDE: B NATRIURETIC PEPTIDE 5: 15 pg/mL (ref 0.0–100.0)

## 2015-04-07 LAB — C-REACTIVE PROTEIN: CRP: 2.6 mg/dL — ABNORMAL HIGH (ref <1.0)

## 2015-04-07 LAB — I-STAT TROPONIN, ED: TROPONIN I, POC: 0 ng/mL (ref 0.00–0.08)

## 2015-04-07 LAB — TROPONIN I

## 2015-04-07 MED ORDER — POVIDONE-IODINE 10 % EX SOLN
CUTANEOUS | Status: AC
  Filled 2015-04-07: qty 118

## 2015-04-07 MED ORDER — OXYCODONE-ACETAMINOPHEN 5-325 MG PO TABS: 1.0000 | ORAL_TABLET | ORAL | Status: DC | PRN

## 2015-04-07 MED ORDER — METHYLPREDNISOLONE SODIUM SUCC 125 MG IJ SOLR
INTRAMUSCULAR | Status: AC
  Filled 2015-04-07: qty 2

## 2015-04-07 MED ORDER — MORPHINE SULFATE (PF) 4 MG/ML IV SOLN
4.0000 mg | Freq: Once | INTRAVENOUS | Status: AC
  Administered 2015-04-07: 4 mg via INTRAVENOUS
  Filled 2015-04-07: qty 1

## 2015-04-07 MED ORDER — LIDOCAINE HCL (PF) 1 % IJ SOLN
10.0000 mL | Freq: Once | INTRAMUSCULAR | Status: AC
  Administered 2015-04-07: 10 mL
  Filled 2015-04-07: qty 10

## 2015-04-07 MED ORDER — HYDROMORPHONE HCL 1 MG/ML IJ SOLN
1.0000 mg | Freq: Once | INTRAMUSCULAR | Status: AC
  Administered 2015-04-07: 1 mg via INTRAVENOUS
  Filled 2015-04-07: qty 1

## 2015-04-07 MED ORDER — PREDNISONE 10 MG PO TABS: 60.0000 mg | ORAL_TABLET | Freq: Every day | ORAL | Status: DC

## 2015-04-07 MED ORDER — IOHEXOL 350 MG/ML SOLN
100.0000 mL | Freq: Once | INTRAVENOUS | Status: AC | PRN
  Administered 2015-04-07: 100 mL via INTRAVENOUS

## 2015-04-07 MED ORDER — KETOROLAC TROMETHAMINE 30 MG/ML IJ SOLN
30.0000 mg | Freq: Once | INTRAMUSCULAR | Status: AC
  Administered 2015-04-07: 30 mg via INTRAVENOUS
  Filled 2015-04-07: qty 1

## 2015-04-07 MED ORDER — ASPIRIN 81 MG PO CHEW
324.0000 mg | CHEWABLE_TABLET | Freq: Once | ORAL | Status: AC
  Administered 2015-04-07: 324 mg via ORAL
  Filled 2015-04-07: qty 4

## 2015-04-07 MED ORDER — METHYLPREDNISOLONE SODIUM SUCC 125 MG IJ SOLR
125.0000 mg | Freq: Once | INTRAMUSCULAR | Status: AC
  Administered 2015-04-07: 125 mg via INTRAVENOUS
  Filled 2015-04-07: qty 2

## 2015-04-07 NOTE — Discharge Instructions (Signed)
Your knee pain and swelling does not appear to be secondary to infection at this time.  When discussed with Dr. Romeo Lindsey from orthopedics he still thought this could be gout related.  Use the pain medication and take the steroids as directed.  Follow up with Dr. Romeo Lindsey as soon as possible.  Gout Gout is an inflammatory arthritis caused by a buildup of uric acid crystals in the joints. Uric acid is a chemical that is normally present in the blood. When the level of uric acid in the blood is too high it can form crystals that deposit in your joints and tissues. This causes joint redness, soreness, and swelling (inflammation). Repeat attacks are common. Over time, uric acid crystals can form into masses (tophi) near a joint, destroying bone and causing disfigurement. Gout is treatable and often preventable. CAUSES  The disease begins with elevated levels of uric acid in the blood. Uric acid is produced by your body when it breaks down a naturally found substance called purines. Certain foods you eat, such as meats and fish, contain high amounts of purines. Causes of an elevated uric acid level include:  Being passed down from parent to child (heredity).  Diseases that cause increased uric acid production (such as obesity, psoriasis, and certain cancers).  Excessive alcohol use.  Diet, especially diets rich in meat and seafood.  Medicines, including certain cancer-fighting medicines (chemotherapy), water pills (diuretics), and aspirin.  Chronic kidney disease. The kidneys are no longer able to remove uric acid well.  Problems with metabolism. Conditions strongly associated with gout include:  Obesity.  High blood pressure.  High cholesterol.  Diabetes. Not everyone with elevated uric acid levels gets gout. It is not understood why some people get gout and others do not. Surgery, joint injury, and eating too much of certain foods are some of the factors that can lead to gout  attacks. SYMPTOMS   An attack of gout comes on quickly. It causes intense pain with redness, swelling, and warmth in a joint.  Fever can occur.  Often, only one joint is involved. Certain joints are more commonly involved:  Base of the big toe.  Knee.  Ankle.  Wrist.  Finger. Without treatment, an attack usually goes away in a few days to weeks. Between attacks, you usually will not have symptoms, which is different from many other forms of arthritis. DIAGNOSIS  Your caregiver will suspect gout based on your symptoms and exam. In some cases, tests may be recommended. The tests may include:  Blood tests.  Urine tests.  X-rays.  Joint fluid exam. This exam requires a needle to remove fluid from the joint (arthrocentesis). Using a microscope, gout is confirmed when uric acid crystals are seen in the joint fluid. TREATMENT  There are two phases to gout treatment: treating the sudden onset (acute) attack and preventing attacks (prophylaxis).  Treatment of an Acute Attack.  Medicines are used. These include anti-inflammatory medicines or steroid medicines.  An injection of steroid medicine into the affected joint is sometimes necessary.  The painful joint is rested. Movement can worsen the arthritis.  You may use warm or cold treatments on painful joints, depending which works best for you.  Treatment to Prevent Attacks.  If you suffer from frequent gout attacks, your caregiver may advise preventive medicine. These medicines are started after the acute attack subsides. These medicines either help your kidneys eliminate uric acid from your body or decrease your uric acid production. You may need to stay on  these medicines for a very long time.  The early phase of treatment with preventive medicine can be associated with an increase in acute gout attacks. For this reason, during the first few months of treatment, your caregiver may also advise you to take medicines usually used  for acute gout treatment. Be sure you understand your caregiver's directions. Your caregiver may make several adjustments to your medicine dose before these medicines are effective.  Discuss dietary treatment with your caregiver or dietitian. Alcohol and drinks high in sugar and fructose and foods such as meat, poultry, and seafood can increase uric acid levels. Your caregiver or dietitian can advise you on drinks and foods that should be limited. HOME CARE INSTRUCTIONS   Do not take aspirin to relieve pain. This raises uric acid levels.  Only take over-the-counter or prescription medicines for pain, discomfort, or fever as directed by your caregiver.  Rest the joint as much as possible. When in bed, keep sheets and blankets off painful areas.  Keep the affected joint raised (elevated).  Apply warm or cold treatments to painful joints. Use of warm or cold treatments depends on which works best for you.  Use crutches if the painful joint is in your leg.  Drink enough fluids to keep your urine clear or pale yellow. This helps your body get rid of uric acid. Limit alcohol, sugary drinks, and fructose drinks.  Follow your dietary instructions. Pay careful attention to the amount of protein you eat. Your daily diet should emphasize fruits, vegetables, whole grains, and fat-free or low-fat milk products. Discuss the use of coffee, vitamin C, and cherries with your caregiver or dietitian. These may be helpful in lowering uric acid levels.  Maintain a healthy body weight. SEEK MEDICAL CARE IF:   You develop diarrhea, vomiting, or any side effects from medicines.  You do not feel better in 24 hours, or you are getting worse. SEEK IMMEDIATE MEDICAL CARE IF:   Your joint becomes suddenly more tender, and you have chills or a fever. MAKE SURE YOU:   Understand these instructions.  Will watch your condition.  Will get help right away if you are not doing well or get worse.   This information  is not intended to replace advice given to you by your health care provider. Make sure you discuss any questions you have with your health care provider.   Document Released: 06/04/2000 Document Revised: 06/28/2014 Document Reviewed: 01/19/2012 Elsevier Interactive Patient Education [employer]2016 [employer]

## 2015-04-07 NOTE — ED Provider Notes (Signed)
CSN: [id number]     Arrival date & time 04/07/15  0915 History  By signing my name below, I, Stephania Fragmin, attest that this documentation has been prepared under the direction and in the presence of Harvel Quale, MD. Electronically Signed: Stephania Fragmin, ED Scribe. 04/07/2015. 12:24 AM.   Chief Complaint  Patient presents with  . Chest Pain  . Leg Swelling   The history is provided by the patient. No language interpreter was used.    HPI Comments: Darrell Lindsey is a 44 y.o. male with a history of atrial flutter, COPD, asthma, chronic DOE, anxiety, and cardiac ablation, who presents to the Emergency Department complaining of constant, pressure-like chest pain radiating down his arm and bilateral lower extremity swelling, from his hips all the way down, with onset last night while at rest. He reports he used to be blood thinners, but stopped taking them 3 years ago S/P a cardiac ablation. He is unable to specify if his DOE is worse than baseline. He also reports a family history of deaths by MI at about age 45. He states he has not taken anything this morning for his symptoms.   Past Medical History  Diagnosis Date  . Anxiety   . Gout   . Atrial flutter 2012    Tx with ablation  . Renal disorder   . Chronic back pain   . Lumbar radiculopathy   . COPD (chronic obstructive pulmonary disease)   . Asthma   . OSA on CPAP   . DOE (dyspnea on exertion)     chronic   Past Surgical History  Procedure Laterality Date  . Cardaic ablasion    . Adenoidectomy    . Removal of salivary gland      No family history on file. Social History  Substance Use Topics  . Smoking status: Current Every Day Smoker    Types: Cigarettes  . Smokeless tobacco: Not on file  . Alcohol Use: Yes     Comment: rarely    Review of Systems  Constitutional: Negative for fever, chills and fatigue.  HENT: Negative for congestion, postnasal drip and rhinorrhea.   Eyes: Negative for pain and redness.   Respiratory: Positive for shortness of breath (chronic ). Negative for cough, chest tightness and wheezing.   Cardiovascular: Positive for chest pain and leg swelling.  Gastrointestinal: Negative for nausea, vomiting, abdominal pain and diarrhea.  Genitourinary: Negative for dysuria, urgency and hematuria.  Musculoskeletal: Positive for arthralgias (left knee pain and swelling). Negative for myalgias and back pain.  Skin: Negative for rash and wound.  Neurological: Negative for dizziness, weakness, light-headedness and headaches.  Hematological: Does not bruise/bleed easily.  All other systems reviewed and are negative.   Allergies  Effexor  Home Medications   Prior to Admission medications   Medication Sig Start Date End Date Taking? Authorizing Provider  albuterol (PROAIR HFA) 108 (90 BASE) MCG/ACT inhaler Inhale 1-2 puffs into the lungs every 6 (six) hours as needed for wheezing or shortness of breath.    Historical Provider, MD  allopurinol (ZYLOPRIM) 300 MG tablet Take 300 mg by mouth at bedtime.     Historical Provider, MD  aspirin EC 325 MG tablet Take 325 mg by mouth daily.    Historical Provider, MD  atorvastatin (LIPITOR) 20 MG tablet Take 20 mg by mouth daily.    Historical Provider, MD  clindamycin (CLEOCIN) 300 MG capsule Take 1 capsule (300 mg total) by mouth 4 (four) times daily. X  7 days 07/29/14   Daleen Bo, MD  clonazePAM (KLONOPIN) 0.5 MG tablet Take 0.5 mg by mouth 3 (three) times daily as needed for anxiety.     Historical Provider, MD  fluticasone (FLONASE) 50 MCG/ACT nasal spray Place 1 spray into both nostrils daily as needed for allergies or rhinitis.    Historical Provider, MD  Fluticasone-Salmeterol (ADVAIR) 250-50 MCG/DOSE AEPB Inhale 1 puff into the lungs 2 (two) times daily.    Historical Provider, MD  ibuprofen (ADVIL,MOTRIN) 200 MG tablet Take 200 mg by mouth every 6 (six) hours as needed for mild pain or moderate pain.     Historical Provider, MD   ipratropium-albuterol (DUONEB) 0.5-2.5 (3) MG/3ML SOLN Take 3 mLs by nebulization 4 (four) times daily.    Historical Provider, MD  lisinopril (PRINIVIL,ZESTRIL) 10 MG tablet Take 10 mg by mouth every evening.     Historical Provider, MD  metoprolol succinate (TOPROL-XL) 50 MG 24 hr tablet Take 75 mg by mouth daily. Take with or immediately following a meal.    Historical Provider, MD  nitroGLYCERIN (NITROSTAT) 0.4 MG SL tablet Place 0.4 mg under the tongue every 5 (five) minutes as needed for chest pain.    Historical Provider, MD  oxyCODONE-acetaminophen (PERCOCET/ROXICET) 5-325 MG per tablet Take 1 tablet by mouth every 4 (four) hours as needed. 07/29/14   Daleen Bo, MD  tamsulosin (FLOMAX) 0.4 MG CAPS capsule Take 0.4 mg by mouth daily after supper.    Historical Provider, MD  traMADol (ULTRAM) 50 MG tablet Take 100 mg by mouth every 4 (four) hours as needed for moderate pain or severe pain.     Historical Provider, MD   There were no vitals taken for this visit. Physical Exam  Constitutional: He is oriented to person, place, and time. He appears well-developed and well-nourished. No distress.  HENT:  Head: Normocephalic and atraumatic.  Right Ear: External ear normal.  Left Ear: External ear normal.  Mouth/Throat: Oropharynx is clear and moist. No oropharyngeal exudate.  Eyes: Conjunctivae and EOM are normal. Pupils are equal, round, and reactive to light.  Neck: Normal range of motion. Neck supple. No tracheal deviation present.  Cardiovascular: Normal rate, regular rhythm, normal heart sounds and intact distal pulses.   No murmur heard. Pulmonary/Chest: Effort normal. No respiratory distress. He has no wheezes. He has no rales.  Abdominal: Soft. He exhibits no distension. There is no tenderness.  Musculoskeletal: He exhibits edema (1+ edema of the left lower extremity most centered around the knee).       Right hip: Normal.       Left hip: Normal.       Right knee: Normal.        Left knee: He exhibits decreased range of motion (secondary to pain), swelling and effusion. He exhibits no ecchymosis and no erythema. Tenderness found. Medial joint line tenderness noted.       Right ankle: Normal.       Left ankle: Normal.       Lumbar back: Normal.  Neurological: He is alert and oriented to person, place, and time.  Skin: Skin is warm and dry. No rash noted. He is not diaphoretic.  Psychiatric: He has a normal mood and affect. His behavior is normal.  Nursing note and vitals reviewed.   ED Course  .Joint Aspiration/Arthrocentesis Date/Time: 04/07/2015 5:13 PM Performed by: Harvel Quale Authorized by: Harvel Quale Consent: Verbal consent obtained. Risks and benefits: risks, benefits and alternatives were discussed Consent  given by: patient Patient understanding: patient states understanding of the procedure being performed Patient consent: the patient's understanding of the procedure matches consent given Required items: required blood products, implants, devices, and special equipment available Patient identity confirmed: verbally with patient Indications: joint swelling and pain  Body area: knee Joint: left knee Local anesthesia used: yes Anesthesia: local infiltration Local anesthetic: lidocaine 1% without epinephrine Patient sedated: no Preparation: Patient was prepped and draped in the usual sterile fashion. Needle gauge: 18 G Ultrasound guidance: no Approach: superior Aspirate: yellow Aspirate amount: 35 mL Patient tolerance: Patient tolerated the procedure well with no immediate complications Comments: Fluid sent for analysis   (including critical care time)  DIAGNOSTIC STUDIES: Oxygen Saturation is 98% on RA, normal by my interpretation.    COORDINATION OF CARE: 9:35 AM - Discussed treatment plan with pt at bedside which includes aspirin and diagnostic testing. Pt verbalized understanding and agreed to plan.   Labs Review Labs  Reviewed  BASIC METABOLIC PANEL - Abnormal; Notable for the following:    Glucose, Bld 102 (*)    Creatinine, Ser 1.34 (*)    Calcium 8.8 (*)    All other components within normal limits  CBC - Abnormal; Notable for the following:    WBC 12.2 (*)    All other components within normal limits  BRAIN NATRIURETIC PEPTIDE  PROTIME-INR  I-STAT TROPOININ, ED    Imaging Review Dg Chest 2 View  04/07/2015  CLINICAL DATA:  Chest pain.  Left lower extremity pain and swelling. EXAM: CHEST  2 VIEW COMPARISON:  11/12/2014 FINDINGS: Stable cardiomediastinal silhouette with normal heart size. No pneumothorax. No pleural effusion. Clear lungs, with no focal lung consolidation and no pulmonary edema. IMPRESSION: No active disease in the chest. Electronically Signed   By: Ilona Sorrel M.D.   On: 04/07/2015 10:40   Ct Angio Chest Pe W/cm &/or Wo Cm  04/07/2015  CLINICAL DATA:  Chest pain and leg swelling.  History of DVT EXAM: CT ANGIOGRAPHY CHEST WITH CONTRAST TECHNIQUE: Multidetector CT imaging of the chest was performed using the standard protocol during bolus administration of intravenous contrast. Multiplanar CT image reconstructions and MIPs were obtained to evaluate the vascular anatomy. CONTRAST:  132m OMNIPAQUE IOHEXOL 350 MG/ML SOLN COMPARISON:  None. FINDINGS: THORACIC INLET/BODY WALL: No acute abnormality. MEDIASTINUM: Normal heart size. No pericardial effusion. CTA of the pulmonary arteries is limited by patient size, bolus dispersion, and intermittent motion, but study is overall diagnostic and negative for pulmonary embolism. No adenopathy. LUNG WINDOWS: Diffuse bronchial wall thickening without collapse or consolidation. 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 The nodule is partially visualized today. OSSEOUS: No acute fracture.  No suspicious lytic or blastic lesions. Review of the MIP images confirms the above findings. IMPRESSION: 1.  No evidence of pulmonary embolism. 2. Bronchitis. Electronically Signed   By: JMonte FantasiaM.D.   On: 04/07/2015 11:14   [place]Venous Img Lower Unilateral Left  04/07/2015  CLINICAL DATA:  Chest pain and leg swelling.  History of DVT EXAM: Left LOWER EXTREMITY VENOUS DOPPLER ULTRASOUND TECHNIQUE: Gray-scale sonography with graded compression, as well as color Doppler and duplex ultrasound were performed to evaluate the lower extremity deep venous systems from the level of the common femoral vein and including the common femoral, femoral, profunda femoral, popliteal and calf veins including the posterior tibial, peroneal and gastrocnemius veins when visible. The superficial great saphenous vein was also interrogated.  Spectral Doppler was utilized to evaluate flow at rest and with distal augmentation maneuvers in the common femoral, femoral and popliteal veins. COMPARISON:  01/26/2012 FINDINGS: Contralateral Common Femoral Vein: Respiratory phasicity is normal and symmetric with the symptomatic side. No evidence of thrombus. Normal compressibility. Common Femoral Vein: No evidence of thrombus. Saphenofemoral Junction: No evidence of thrombus. Profunda Femoral Vein: No evidence of thrombus. Femoral Vein: No evidence of thrombus. Popliteal Vein: No evidence of thrombus. Calf Veins: No evidence of thrombus. Superficial Great Saphenous Vein: No evidence of thrombus. Other Findings: There is a large partly visualized fluid collection with predominantly simple anechoic contents measuring 8 x 3 x 7 cm. The collection appears deep and is favored to reflect knee joint effusion. The collection is not in the popliteal fossa to suggest Baker cyst. IMPRESSION: 1. No evidence of left lower extremity DVT. 2. Large fluid collection at the knee, likely joint effusion. Electronically Signed   By: Monte Fantasia M.D.   On: 04/07/2015 11:27   I have personally reviewed and evaluated these images and lab results as part of my  medical decision-making.   EKG Interpretation   Date/Time:  Monday April 07 2015 09:23:35 EDT Ventricular Rate:  83 PR Interval:  162 QRS Duration: 90 QT Interval:  366 QTC Calculation: 430 R Axis:   57 Text Interpretation:  Sinus rhythm No previous ECGs available Confirmed by  Jkai Arwood ([id number]) on 04/07/2015 9:27:32 AM      MDM  Patient was seen and evaluated in stable condition.  Initial concern most for PE/DVT.  Cardiac labs, EKG, Chest xray, CT angiography of chest, and Dopplers of lower extremities unremarkable.  Patient noted to have large left knee effusion.  Patient with continued left knee pain and difficult to range knee secondary to pain.  For this reason ESR, synovial fluid, left knee xray reviewed.  Xray again with large effusion.  ESR 11.  Synovial fluid with 11,278 WBC and 92% PMNs, no crystals.  Discussed with Dr. Aline Brochure from orthopedics who said that since WBC count that low despite no crystals and 92% PMNs he felt this likely still a gout or gout like reaction and recommended knee immobilizer, treatment for gout and outpatient follow up in his office.  Discussed with patient who expressed understanding and agreement.  Patient given Solumedrol and discharged with Prednisone and Percocet.  Strict return precautions given. Final diagnoses:  None    1. Left knee pain  2. Left knee effusion, possible gout like reaction   I personally performed the services described in this documentation, which was scribed in my presence. The recorded information has been reviewed and is accurate.      Harvel Quale, MD 04/07/15 [id number]

## 2015-04-07 NOTE — ED Notes (Signed)
Pt verbalized understanding of no driving and to use caution within 4 hours of taking pain meds due to meds cause drowsiness 

## 2015-04-07 NOTE — ED Notes (Signed)
Cart in room for procedure

## 2015-04-07 NOTE — ED Notes (Signed)
Pt c/o of 10/10 LT sided CP radiating to LT shoulder since last night. Pt also c/o of LT leg edema and pain. Pt has hx of DVT. AOx4.

## 2015-04-12 LAB — CULTURE, BODY FLUID-BOTTLE: CULTURE: NO GROWTH

## 2015-05-24 ENCOUNTER — Emergency Department (HOSPITAL_COMMUNITY): Payer: Medicaid Other

## 2015-05-24 ENCOUNTER — Encounter (HOSPITAL_COMMUNITY): Payer: Self-pay | Admitting: Emergency Medicine

## 2015-05-24 ENCOUNTER — Emergency Department (HOSPITAL_COMMUNITY)
Admission: EM | Admit: 2015-05-24 | Discharge: 2015-05-24 | Disposition: A | Discharge status: Home / Self Care | Payer: Medicaid Other | Attending: Emergency Medicine | Admitting: Emergency Medicine

## 2015-05-24 DIAGNOSIS — G4733 Obstructive sleep apnea (adult) (pediatric): Secondary | ICD-10-CM | POA: Insufficient documentation

## 2015-05-24 DIAGNOSIS — M25472 Effusion, left ankle: Secondary | ICD-10-CM | POA: Diagnosis not present

## 2015-05-24 DIAGNOSIS — I4892 Unspecified atrial flutter: Secondary | ICD-10-CM | POA: Insufficient documentation

## 2015-05-24 DIAGNOSIS — M109 Gout, unspecified: Secondary | ICD-10-CM | POA: Diagnosis not present

## 2015-05-24 DIAGNOSIS — Z7951 Long term (current) use of inhaled steroids: Secondary | ICD-10-CM | POA: Insufficient documentation

## 2015-05-24 DIAGNOSIS — Z7982 Long term (current) use of aspirin: Secondary | ICD-10-CM | POA: Diagnosis not present

## 2015-05-24 DIAGNOSIS — Z79899 Other long term (current) drug therapy: Secondary | ICD-10-CM | POA: Diagnosis not present

## 2015-05-24 DIAGNOSIS — F419 Anxiety disorder, unspecified: Secondary | ICD-10-CM | POA: Diagnosis not present

## 2015-05-24 DIAGNOSIS — Z9981 Dependence on supplemental oxygen: Secondary | ICD-10-CM | POA: Diagnosis not present

## 2015-05-24 DIAGNOSIS — G8929 Other chronic pain: Secondary | ICD-10-CM | POA: Diagnosis not present

## 2015-05-24 DIAGNOSIS — Z87448 Personal history of other diseases of urinary system: Secondary | ICD-10-CM | POA: Diagnosis not present

## 2015-05-24 DIAGNOSIS — J449 Chronic obstructive pulmonary disease, unspecified: Secondary | ICD-10-CM | POA: Diagnosis not present

## 2015-05-24 DIAGNOSIS — M7989 Other specified soft tissue disorders: Secondary | ICD-10-CM

## 2015-05-24 DIAGNOSIS — F17210 Nicotine dependence, cigarettes, uncomplicated: Secondary | ICD-10-CM | POA: Insufficient documentation

## 2015-05-24 DIAGNOSIS — M79605 Pain in left leg: Secondary | ICD-10-CM | POA: Diagnosis present

## 2015-05-24 MED ORDER — HYDROCODONE-ACETAMINOPHEN 5-325 MG PO TABS
2.0000 | ORAL_TABLET | Freq: Once | ORAL | Status: AC
  Administered 2015-05-24: 2 via ORAL
  Filled 2015-05-24: qty 2

## 2015-05-24 MED ORDER — MORPHINE SULFATE (PF) 4 MG/ML IV SOLN
8.0000 mg | Freq: Once | INTRAVENOUS | Status: AC
  Administered 2015-05-24: 8 mg via INTRAMUSCULAR
  Filled 2015-05-24: qty 2

## 2015-05-24 MED ORDER — IBUPROFEN 400 MG PO TABS
600.0000 mg | ORAL_TABLET | Freq: Once | ORAL | Status: AC
  Administered 2015-05-24: 600 mg via ORAL
  Filled 2015-05-24: qty 2

## 2015-05-24 MED ORDER — HYDROCODONE-ACETAMINOPHEN 5-325 MG PO TABS: 2.0000 | ORAL_TABLET | ORAL | Status: DC | PRN

## 2015-05-24 MED ORDER — PREDNISONE 20 MG PO TABS: ORAL_TABLET | ORAL | Status: DC

## 2015-05-24 NOTE — ED Notes (Signed)
Pt states that he started having left foot pain about a week ago and 2 days ago veins started appearing swollen with pain in calf.

## 2015-05-24 NOTE — Discharge Instructions (Signed)
If you were given medicines take as directed.  If you are on coumadin or contraceptives realize their levels and effectiveness is altered by many different medicines.  If you have any reaction (rash, tongues swelling, other) to the medicines stop taking and see a physician.    If your blood pressure was elevated in the ER make sure you follow up for management with a primary doctor or return for chest pain, shortness of breath or stroke symptoms.  Please follow up as directed and return to the ER or see a physician for new or worsening symptoms.  Thank you. Filed Vitals:   05/24/15 1013  BP: 147/100  Pulse: 90  Temp: 97.8 F (36.6 C)  TempSrc: Oral  Resp: 20  Height: 6' (1.829 m)  Weight: 330 lb (149.687 kg)  SpO2: 97%

## 2015-05-24 NOTE — ED Provider Notes (Signed)
CSN: [id number]     Arrival date & time 05/24/15  1006 History   First MD Initiated Contact with Patient 05/24/15 1014     Chief Complaint  Patient presents with  . Leg Pain     (Consider location/radiation/quality/duration/timing/severity/associated sxs/prior Treatment) HPI Comments: 44 year old male with history of gout, smoking, obesity, superficial venous clot, arthritis, COPD, sleep apnea presents with left leg/calf swelling and pain. Started last night as swelling discomfort in the left foot and move the ankle and now he has swelling and pain in the calf. No recent surgery, no DVT history patient has had a superficial clot in the past no current blood thinners, no long travel, no active cancer.patient is had his left knee tapped for gout in the past. No fevers or chills no injuries. Patient takes allopurinol   Patient is a 44 y.o. male presenting with leg pain. The history is provided by the patient.  Leg Pain Associated symptoms: no back pain, no fever and no neck pain     Past Medical History  Diagnosis Date  . Anxiety   . Gout   . Atrial flutter (HCC) 2012    Tx with ablation  . Renal disorder   . Chronic back pain   . Lumbar radiculopathy   . COPD (chronic obstructive pulmonary disease) (HCC)   . Asthma   . OSA on CPAP   . DOE (dyspnea on exertion)     chronic   Past Surgical History  Procedure Laterality Date  . Cardaic ablasion    . Adenoidectomy    . Removal of salivary gland      History reviewed. No pertinent family history. Social History  Substance Use Topics  . Smoking status: Current Every Day Smoker    Types: Cigarettes  . Smokeless tobacco: None  . Alcohol Use: Yes     Comment: rarely    Review of Systems  Constitutional: Negative for fever and chills.  HENT: Negative for congestion.   Eyes: Negative for visual disturbance.  Respiratory: Negative for shortness of breath.   Cardiovascular: Positive for leg swelling. Negative for chest pain.   Gastrointestinal: Negative for vomiting and abdominal pain.  Genitourinary: Negative for dysuria and flank pain.  Musculoskeletal: Positive for joint swelling. Negative for back pain, neck pain and neck stiffness.  Skin: Negative for rash.  Neurological: Negative for light-headedness and headaches.      Allergies  Effexor  Home Medications   Prior to Admission medications   Medication Sig Start Date End Date Taking? Authorizing Provider  albuterol (PROAIR HFA) 108 (90 BASE) MCG/ACT inhaler Inhale 1-2 puffs into the lungs every 6 (six) hours as needed for wheezing or shortness of breath.   Yes Historical Provider, MD  albuterol (PROVENTIL) (2.5 MG/3ML) 0.083% nebulizer solution Take 2.5 mg by nebulization every 6 (six) hours as needed for wheezing or shortness of breath.   Yes Historical Provider, MD  allopurinol (ZYLOPRIM) 300 MG tablet Take 300 mg by mouth at bedtime.    Yes Historical Provider, MD  aspirin EC 325 MG tablet Take 325 mg by mouth daily.   Yes Historical Provider, MD  atorvastatin (LIPITOR) 20 MG tablet Take 20 mg by mouth daily.   Yes Historical Provider, MD  clonazePAM (KLONOPIN) 0.5 MG tablet Take 0.5 mg by mouth 3 (three) times daily as needed for anxiety.    Yes Historical Provider, MD  fluticasone (FLONASE) 50 MCG/ACT nasal spray Place 1 spray into both nostrils daily as needed for allergies or  rhinitis.   Yes Historical Provider, MD  Fluticasone-Salmeterol (ADVAIR) 250-50 MCG/DOSE AEPB Inhale 1 puff into the lungs 2 (two) times daily.   Yes Historical Provider, MD  gabapentin (NEURONTIN) 400 MG capsule Take 800 mg by mouth 3 (three) times daily.   Yes Historical Provider, MD  ibuprofen (ADVIL,MOTRIN) 200 MG tablet Take 200 mg by mouth every 6 (six) hours as needed for mild pain or moderate pain.    Yes Historical Provider, MD  lisinopril (PRINIVIL,ZESTRIL) 10 MG tablet Take 10 mg by mouth every evening.    Yes Historical Provider, MD  metoprolol succinate  (TOPROL-XL) 50 MG 24 hr tablet Take 75 mg by mouth daily. Take with or immediately following a meal.   Yes Historical Provider, MD  Morphine-Naltrexone 20-0.8 MG CPCR Take 1 tablet by mouth daily as needed (pain).    Yes Historical Provider, MD  nitroGLYCERIN (NITROSTAT) 0.4 MG SL tablet Place 0.4 mg under the tongue every 5 (five) minutes as needed for chest pain.   Yes Historical Provider, MD  tamsulosin (FLOMAX) 0.4 MG CAPS capsule Take 0.4 mg by mouth daily after supper.   Yes Historical Provider, MD  tiotropium (SPIRIVA) 18 MCG inhalation capsule Place 1 capsule into inhaler and inhale daily.   Yes Historical Provider, MD  oxyCODONE-acetaminophen (PERCOCET/ROXICET) 5-325 MG tablet Take 1 tablet by mouth every 4 (four) hours as needed for moderate pain or severe pain. Patient not taking: Reported on 05/24/2015 04/07/15   Leta Baptist, MD  predniSONE (DELTASONE) 10 MG tablet Take 6 tablets (60 mg total) by mouth daily. Patient not taking: Reported on 05/24/2015 04/07/15   Leta Baptist, MD   BP 147/100 mmHg  Pulse 90  Temp(Src) 97.8 F (36.6 C) (Oral)  Resp 20  Ht 6' (1.829 m)  Wt 330 lb (149.687 kg)  BMI 44.75 kg/m2  SpO2 97% Physical Exam  Constitutional: He is oriented to person, place, and time. He appears well-developed and well-nourished.  HENT:  Head: Normocephalic and atraumatic.  Eyes: Conjunctivae are normal. Right eye exhibits no discharge. Left eye exhibits no discharge.  Neck: Normal range of motion. Neck supple. No tracheal deviation present.  Cardiovascular: Normal rate and regular rhythm.   Pulmonary/Chest: Effort normal.  Musculoskeletal: He exhibits edema and tenderness.  Patient has moderate tenderness to left calf and left ankle with range of motion, no significant warmth, no rash, neurovascularly intact with 2+ pulses distal. Compartment soft.  Neurological: He is alert and oriented to person, place, and time.  Skin: Skin is warm. No rash noted.   Psychiatric: He has a normal mood and affect.  Nursing note and vitals reviewed.   ED Course  Procedures (including critical care time) Labs Review Labs Reviewed - No data to display  Imaging Review No results found. I have personally reviewed and evaluated these images and lab results as part of my medical decision-making.   EKG Interpretation None      MDM   Final diagnoses:  Left ankle effusion  Left leg swelling   Asian presents with worsening pain and swelling from the calf down to the ankle. Discussed differential diagnosis including gout, DVT, other inflammatory pathology. Plan for ultrasound, pain meds. Very low suspicion for septic joint at this time. Ultrasound no DVT. Discussed follow-up with orthopedics, pain meds and prednisone for likely gout. Discussed ultrasound results with radiology negative DVT study. Results and differential diagnosis were discussed with the patient/parent/guardian. Xrays were independently reviewed by myself.  Close follow up outpatient  was discussed, comfortable with the plan.   Medications  HYDROcodone-acetaminophen (NORCO/VICODIN) 5-325 MG per tablet 2 tablet (not administered)  ibuprofen (ADVIL,MOTRIN) tablet 600 mg (not administered)    Filed Vitals:   05/24/15 1013  BP: 147/100  Pulse: 90  Temp: 97.8 F (36.6 C)  TempSrc: Oral  Resp: 20  Height: 6' (1.829 m)  Weight: 330 lb (149.687 kg)  SpO2: 97%    Final diagnoses:  Left ankle effusion  Left leg swelling        Blane OharaJoshua Neldon Shepard, MD 05/24/15 1306

## 2015-05-26 ENCOUNTER — Emergency Department (HOSPITAL_COMMUNITY)
Admission: EM | Admit: 2015-05-26 | Discharge: 2015-05-26 | Disposition: A | Discharge status: Home / Self Care | Payer: Medicaid Other | Attending: Emergency Medicine | Admitting: Emergency Medicine

## 2015-05-26 ENCOUNTER — Encounter (HOSPITAL_COMMUNITY): Payer: Self-pay | Admitting: Emergency Medicine

## 2015-05-26 ENCOUNTER — Emergency Department (HOSPITAL_COMMUNITY): Payer: Medicaid Other

## 2015-05-26 DIAGNOSIS — Z8679 Personal history of other diseases of the circulatory system: Secondary | ICD-10-CM | POA: Diagnosis not present

## 2015-05-26 DIAGNOSIS — Z87448 Personal history of other diseases of urinary system: Secondary | ICD-10-CM | POA: Insufficient documentation

## 2015-05-26 DIAGNOSIS — G8929 Other chronic pain: Secondary | ICD-10-CM | POA: Insufficient documentation

## 2015-05-26 DIAGNOSIS — J449 Chronic obstructive pulmonary disease, unspecified: Secondary | ICD-10-CM | POA: Insufficient documentation

## 2015-05-26 DIAGNOSIS — F17210 Nicotine dependence, cigarettes, uncomplicated: Secondary | ICD-10-CM | POA: Insufficient documentation

## 2015-05-26 DIAGNOSIS — Z9981 Dependence on supplemental oxygen: Secondary | ICD-10-CM | POA: Insufficient documentation

## 2015-05-26 DIAGNOSIS — G4733 Obstructive sleep apnea (adult) (pediatric): Secondary | ICD-10-CM | POA: Insufficient documentation

## 2015-05-26 DIAGNOSIS — F419 Anxiety disorder, unspecified: Secondary | ICD-10-CM | POA: Diagnosis not present

## 2015-05-26 DIAGNOSIS — Z79899 Other long term (current) drug therapy: Secondary | ICD-10-CM | POA: Diagnosis not present

## 2015-05-26 DIAGNOSIS — K625 Hemorrhage of anus and rectum: Secondary | ICD-10-CM | POA: Diagnosis not present

## 2015-05-26 DIAGNOSIS — Z7951 Long term (current) use of inhaled steroids: Secondary | ICD-10-CM | POA: Insufficient documentation

## 2015-05-26 DIAGNOSIS — M109 Gout, unspecified: Secondary | ICD-10-CM | POA: Insufficient documentation

## 2015-05-26 DIAGNOSIS — Z7982 Long term (current) use of aspirin: Secondary | ICD-10-CM | POA: Diagnosis not present

## 2015-05-26 LAB — CBC WITH DIFFERENTIAL/PLATELET
BASOS ABS: 0.1 10*3/uL (ref 0.0–0.1)
BASOS PCT: 1 %
EOS PCT: 1 %
Eosinophils Absolute: 0.1 10*3/uL (ref 0.0–0.7)
HCT: 48.1 % (ref 39.0–52.0)
Hemoglobin: 16.1 g/dL (ref 13.0–17.0)
Lymphocytes Relative: 32 %
Lymphs Abs: 3.6 10*3/uL (ref 0.7–4.0)
MCH: 30.4 pg (ref 26.0–34.0)
MCHC: 33.5 g/dL (ref 30.0–36.0)
MCV: 90.9 fL (ref 78.0–100.0)
MONO ABS: 1.1 10*3/uL — AB (ref 0.1–1.0)
Monocytes Relative: 10 %
Neutro Abs: 6.1 10*3/uL (ref 1.7–7.7)
Neutrophils Relative %: 56 %
PLATELETS: 232 10*3/uL (ref 150–400)
RBC: 5.29 MIL/uL (ref 4.22–5.81)
RDW: 13.1 % (ref 11.5–15.5)
WBC: 11 10*3/uL — ABNORMAL HIGH (ref 4.0–10.5)

## 2015-05-26 LAB — COMPREHENSIVE METABOLIC PANEL
ALK PHOS: 118 U/L (ref 38–126)
ALT: 16 U/L — AB (ref 17–63)
AST: 13 U/L — AB (ref 15–41)
Albumin: 3.8 g/dL (ref 3.5–5.0)
Anion gap: 10 (ref 5–15)
BILIRUBIN TOTAL: 0.4 mg/dL (ref 0.3–1.2)
BUN: 14 mg/dL (ref 6–20)
CO2: 21 mmol/L — ABNORMAL LOW (ref 22–32)
CREATININE: 1.09 mg/dL (ref 0.61–1.24)
Calcium: 8.8 mg/dL — ABNORMAL LOW (ref 8.9–10.3)
Chloride: 111 mmol/L (ref 101–111)
GFR calc Af Amer: >60 mL/min (ref >60)
Glucose, Bld: 105 mg/dL — ABNORMAL HIGH (ref 65–99)
Potassium: 3.9 mmol/L (ref 3.5–5.1)
Sodium: 142 mmol/L (ref 135–145)
TOTAL PROTEIN: 6.8 g/dL (ref 6.5–8.1)

## 2015-05-26 LAB — LIPASE, BLOOD: LIPASE: 104 U/L — AB (ref 11–51)

## 2015-05-26 MED ORDER — IOHEXOL 300 MG/ML  SOLN
125.0000 mL | Freq: Once | INTRAMUSCULAR | Status: AC | PRN
  Administered 2015-05-26: 125 mL via INTRAVENOUS

## 2015-05-26 MED ORDER — DIATRIZOATE MEGLUMINE & SODIUM 66-10 % PO SOLN
ORAL | Status: AC
  Filled 2015-05-26: qty 30

## 2015-05-26 NOTE — ED Notes (Signed)
First BM about tablespoon amount just blood.  Second BM was moderate amount of blood with small amount of stool.  Denies pain and Dizziness.  C/o bulging and flatus.

## 2015-05-26 NOTE — Discharge Instructions (Signed)
°Emergency Department Resource Guide °1) Find a Doctor and Pay Out of Pocket °Although you won't have to find out who is covered by your insurance plan, it is a good idea to ask around and get recommendations. You will then need to call the office and see if the doctor you have chosen will accept you as a new patient and what types of options they offer for patients who are self-pay. Some doctors offer discounts or will set up payment plans for their patients who do not have insurance, but you will need to ask so you aren't surprised when you get to your appointment. ° °2) Contact Your Local Health Department °Not all health departments have doctors that can see patients for sick visits, but many do, so it is worth a call to see if yours does. If you don't know where your local health department is, you can check in your phone book. The CDC also has a tool to help you locate your state's health department, and many state websites also have listings of all of their local health departments. ° °3) Find a Walk-in Clinic °If your illness is not likely to be very severe or complicated, you may want to try a walk in clinic. These are popping up all over the country in pharmacies, drugstores, and shopping centers. They're usually staffed by nurse practitioners or physician assistants that have been trained to treat common illnesses and complaints. They're usually fairly quick and inexpensive. However, if you have serious medical issues or chronic medical problems, these are probably not your best option. ° °No Primary Care Doctor: °- Call [facility] Connect at  832-8000 - they can help you locate a primary care doctor that  accepts your insurance, provides certain services, etc. °- Physician Referral Service- 1-800-533-3463 ° °Chronic Pain Problems: °Organization         Address  Phone   Notes  °[facility]  ([phone]) [phone] Patients need to be referred by their primary care doctor.  ° °Medication  Assistance: °Organization         Address  Phone   Notes  °[place] [facility] [address] °[place], [place] [address] (336) 641-8030 --Must be a resident of [place] °-- Must have NO insurance coverage whatsoever (no Medicaid/ Medicare, etc.) °-- The pt. MUST have a primary care doctor that directs their care regularly and follows them in the community °  °[facility]  (866) 331-1348   °[employer]  (888) 892-1162   ° °Agencies that provide inexpensive medical care: °Organization         Address  Phone   Notes  °[facility]  (336) 832-8035   °[facility]    (336) 832-7272   °[facility] [address] °[place], [place] [address] (336) 832-4777   °[facility] [address], °[place] (336) 271-4999   °[facility]    (336) 373-0678   °[facility]    (336) 272-1050   °[facility] ° [address], [place] Phone:  (336) 832-4444, Fax:  (336) 832-4440 Hours of Operation:  9 am - 6 pm, M-F.  Also accepts Medicaid/Medicare and self-pay.  °[facility] ° [address], [place] Phone: (336) [phone], Fax: (336) [phone]. Hours of Operation:  8:30 am - 5:30 pm, M-F.  Also accepts Medicaid and self-pay.  °[facility] [place] [address], [place] Phone: (336) 878-6027   °[facility] [address], [place], [place] (336)723-1848, Ext. 123 Mondays & Thursdays: 7-9 AM.  First 15 patients are seen on a first come, first serve basis. °  ° °Medicaid-accepting [place] Providers: ° °Organization         Address  Phone   Notes  °[facility] [address], [place] (336) 641-2100 Also accepts self-pay patients.  °[facility] [address], [place] ° (336) 856-9996   °[facility] [address], [place]  (336) 288-8857   °[facility] [address] [place] [address], [place] (336) 299-7000   °Veita Bland [address], [place]  ° (336) 373-1557 Only accepts [place] Access Medicaid patients after they have their name applied to their card.  ° °Self-Pay (no insurance) in [place]: ° °Organization         Address  Phone   Notes  °Sickle Cell Patients, [facility] [address], [place] (336) 832-1970   °[facility] [address], [place] (336) 832-4400   °[facility] ° [address], [place] (336) 992-4800   °[facility]/Dr. Osei-Bonsu ° [address] [place] [address], [place] or [address], [place] (336) [phone] Phone number for both [place] and [place] locations is the same.  °[facility] [address], [place] (336) 299-0000   °[facility] [address] [place] [address], [place] or [address] (336) 852-7530 °(336) 878-2260   °[facility] [address], [place] (336) 350-1642, phone; (336) 294-5005, fax Sees patients 1st and 3rd Saturday of every month.  Must not qualify for public or private insurance (i.e. Medicaid, Medicare, [place] Health Choice, Veterans' Benefits) • Household income should be no more than 200% of the poverty level •The clinic cannot treat you if you are pregnant or think you are pregnant • Sexually transmitted diseases are not treated at the clinic.  ° ° °Dental Care: °Organization         Address  Phone  Notes  °[place] [facility] [address], [place] (336) 641-6152 Accepts children up to age 21 who are enrolled in Medicaid or [place] Health Choice; pregnant women with a Medicaid card; and children who have applied for Medicaid or [place] Health Choice, but were declined, whose parents can pay a reduced fee at time of service.  °[place]  [facility] [place]  [address], [place] (336) 641-7733 Accepts children up to age 21 who are enrolled in Medicaid or [place] Health Choice; pregnant women with a Medicaid card; and children who have applied for Medicaid or [place] Health Choice, but were declined, whose parents can pay a reduced fee at time of service.  °[facility] ° [address], [place] (336) 641-4533 Patients are seen by appointment only. Walk-ins are not accepted. [facility] will see patients 18 years of age and older. °Monday - Tuesday (8am-5pm) °Most Wednesdays (8:30-5pm) °$30 per visit, cash only  °[facility] ° [address], [place] (336) 641-4533 Patients are seen by appointment only. Walk-ins are not accepted. [facility] will see patients 18 years of age and older. °One   Wednesday Evening (Monthly: Volunteer Based).  $30 per visit, cash only  °[facility]  (919) [phone] for adults; Children under age 4, call Graduate Pediatric Dentistry at ([phone]) [phone]. Children aged 4-14, please call ([phone]) [phone] to request a pediatric application. ° Dental services are provided in all areas of dental care including fillings, crowns and bridges, complete and partial dentures, implants, gum treatment, root canals, and extractions. Preventive care is also provided. Treatment is provided to both adults and children. °Patients are selected via a lottery and there is often a waiting list. °  °[facility] [address], °[place] ° (336) 763-8833 [email or web address] °  °[facility] [address], [place], [place] (336)723-1848, Ext. 123 Second and Fourth Thursday of each month, opens at 6:30 AM; Clinic ends at 9 AM.  Patients are seen on a first-come first-served basis, and a limited number are seen during each clinic.  ° °[facility] ° [address], [place], [place] (336) 723-7904    Eligibility Requirements °You must have lived in [place], [place], or [place] counties for at least the last three months. °  You cannot be eligible for state or federal sponsored healthcare insurance, including Veterans Administration, Medicaid, or Medicare. °  You generally cannot be eligible for healthcare insurance through your employer.  °  How to apply: °Eligibility screenings are held every Tuesday and Wednesday afternoon from 1:00 pm until 4:00 pm. You do not need an appointment for the interview!  °[facility] [address], [place], [place] 336-631-2330   °[facility]  336-342-8273   °[facility]  336-703-3100   °[facility]  336-570-6415   ° °Behavioral Health Resources in the Community: °Intensive Outpatient Programs °Organization         Address  Phone  Notes  °[facility] [address], [place], [place] 336-878-6098   °[facility] [address], [place], [place] 336-832-9800   °[facility] [address], [place], [place] ° 336-882-2125   °[facility] [address],  °[place], [place] 1-800-853-5163 or 336-641-4981   °Substance Abuse Resources °Organization         Address  Phone  Notes  °[facility]  336-882-2125   °[facility]  336-784-9470   °[facility]  336-285-9073   °[facility]  336-845-3988   °Residential & Outpatient Substance Abuse Program  1-800-659-3381   °Psychological Services °Organization         Address  Phone  Notes  °[facility]  336- 832-9600   °[facility]  336- 378-7881   °[facility] [address], [place] 1-800-853-5163 or 336-641-4981   ° °Mobile Crisis Teams °Organization         Address  Phone  Notes  °[facility], Mobile Crisis Care Unit  1-877-626-1772   °Assertive °[facility] ° [address]  [place], [place] 336-834-9664   °Sharon DeEsch [address] °[place] 336-554-5454   ° °Self-Help/Support Groups °Organization         Address  Phone             Notes  °[facility]  - variety of support groups  336- 373-1402 Call for more information  °Narcotics Anonymous (NA), [facility] [address], °[place]  2 meetings at this location  ° °  Residential Treatment Programs Organization         Address  Phone  Notes  [facility] [address],    [place]  1-610-960-45401-(830)572-6902   [facility]  [address], [place] [phone], [place], [place] [phone]   [facility] [address], [place] [phone] Admissions: 8am-3pm M-F  [facility] [address]    [place], [place] 295-284-1324(804)[phone]   [facility] [address], [place], [place] [phone](740)[phone]   [facility] [address]  [place], [place] [phone]   [facility] Intensive Outpatient [address], [place], [place] [phone]   [facility] ([facility] [address]  [place], [place] [phone] or [phone]   [facility] [address] [place], [place] [phone] Accepts Medicaid  [facility] [address]  [place] [phone] Substance Abuse/Addiction Treatment   [facility] Organization         Address  Phone  Notes  [facility]  320-555-2432(888) 979-312-3348   Angie FavaJulie Brannon, PhD [address], [address] [place], [place]   [phone](336) [phone] or [phone](336) [phone]   [facility]   [address] [place], [place] [phone](336) [phone]   [facility] [address], [place], [place] [phone](336) [phone] Insurance/Medicaid/sponsorship through [facility]  [facility] [address]                                    [place], [place] [phone](336) [phone] Therapy/tele-psych/case    [facility] [address] [place] [address]   [place], [place] [phone](336) [phone]    Dr. Lolly MustacheArfeen  [phone]) [phone]   [facility] [facility]  [employer] [facility] 1) [address], [place] 2) [address], [place] 3)  [address], [place] [phone](336) [phone](336) [phone]  [phone](336) [phone]   [facility] Child Abuse Hotline 6463089234(336) 2265168389 or (623) [phone](336) [phone] (After Hours)      Take your usual prescriptions as previously directed.  Call your regular medical doctor and the GI doctor tomorrow to schedule a follow up appointment this week.  Return to the Emergency Department immediately sooner if worsening.

## 2015-05-26 NOTE — ED Provider Notes (Signed)
CSN: [id number]     Arrival date & time 05/26/15  1634 History   First MD Initiated Contact with Patient 05/26/15 1827     Chief Complaint  Patient presents with  . Rectal Bleeding      HPI Pt was seen at 1825.  Per pt, c/o gradual onset and persistence of constant generalized abd "pain" since this morning.  Has been associated with rectal bleeding.  Describes the abd pain as "bloating." Describes his first stool as "just blood" then the second time he stooled it was "brown with blood." Denies N/V, no diarrhea, no fevers, no back pain, no rash, no CP/SOB, no black stools, denies rectal pain, no testicular pain/swelling, no dysuria/hematuria.   Past Medical History  Diagnosis Date  . Anxiety   . Gout   . Atrial flutter (HCC) 2012    Tx with ablation  . Renal disorder   . Chronic back pain   . Lumbar radiculopathy   . COPD (chronic obstructive pulmonary disease) (HCC)   . Asthma   . OSA on CPAP   . DOE (dyspnea on exertion)     chronic   Past Surgical History  Procedure Laterality Date  . Cardaic ablasion    . Adenoidectomy    . Removal of salivary gland       Social History  Substance Use Topics  . Smoking status: Current Every Day Smoker    Types: Cigarettes  . Smokeless tobacco: None  . Alcohol Use: Yes     Comment: rarely    Review of Systems ROS: Statement: All systems negative except as marked or noted in the HPI; Constitutional: Negative for fever and chills. ; ; Eyes: Negative for eye pain, redness and discharge. ; ; ENMT: Negative for ear pain, hoarseness, nasal congestion, sinus pressure and sore throat. ; ; Cardiovascular: Negative for chest pain, palpitations, diaphoresis, dyspnea and peripheral edema. ; ; Respiratory: Negative for cough, wheezing and stridor. ; ; Gastrointestinal: Negative for nausea, vomiting, diarrhea, hematemesis, jaundice and +abd pain, +rectal bleeding. . ; ; Genitourinary: Negative for dysuria, flank pain and hematuria. ; ;  Musculoskeletal: Negative for back pain and neck pain. Negative for swelling and trauma.; ; Skin: Negative for pruritus, rash, abrasions, blisters, bruising and skin lesion.; ; Neuro: Negative for headache, lightheadedness and neck stiffness. Negative for weakness, altered level of consciousness , altered mental status, extremity weakness, paresthesias, involuntary movement, seizure and syncope.      Allergies  Effexor  Home Medications   Prior to Admission medications   Medication Sig Start Date End Date Taking? Authorizing Provider  albuterol (PROAIR HFA) 108 (90 BASE) MCG/ACT inhaler Inhale 1-2 puffs into the lungs every 6 (six) hours as needed for wheezing or shortness of breath.    Historical Provider, MD  albuterol (PROVENTIL) (2.5 MG/3ML) 0.083% nebulizer solution Take 2.5 mg by nebulization every 6 (six) hours as needed for wheezing or shortness of breath.    Historical Provider, MD  allopurinol (ZYLOPRIM) 300 MG tablet Take 300 mg by mouth at bedtime.     Historical Provider, MD  aspirin EC 325 MG tablet Take 325 mg by mouth daily.    Historical Provider, MD  atorvastatin (LIPITOR) 20 MG tablet Take 20 mg by mouth daily.    Historical Provider, MD  clonazePAM (KLONOPIN) 0.5 MG tablet Take 0.5 mg by mouth 3 (three) times daily as needed for anxiety.     Historical Provider, MD  fluticasone (FLONASE) 50 MCG/ACT nasal spray Place 1 spray  into both nostrils daily as needed for allergies or rhinitis.    Historical Provider, MD  Fluticasone-Salmeterol (ADVAIR) 250-50 MCG/DOSE AEPB Inhale 1 puff into the lungs 2 (two) times daily.    Historical Provider, MD  gabapentin (NEURONTIN) 400 MG capsule Take 800 mg by mouth 3 (three) times daily.    Historical Provider, MD  HYDROcodone-acetaminophen (NORCO) 5-325 MG tablet Take 2 tablets by mouth every 4 (four) hours as needed. 05/24/15   Blane Ohara, MD  ibuprofen (ADVIL,MOTRIN) 200 MG tablet Take 200 mg by mouth every 6 (six) hours as needed for  mild pain or moderate pain.     Historical Provider, MD  lisinopril (PRINIVIL,ZESTRIL) 10 MG tablet Take 10 mg by mouth every evening.     Historical Provider, MD  metoprolol succinate (TOPROL-XL) 50 MG 24 hr tablet Take 75 mg by mouth daily. Take with or immediately following a meal.    Historical Provider, MD  Morphine-Naltrexone 20-0.8 MG CPCR Take 1 tablet by mouth daily as needed (pain).     Historical Provider, MD  nitroGLYCERIN (NITROSTAT) 0.4 MG SL tablet Place 0.4 mg under the tongue every 5 (five) minutes as needed for chest pain.    Historical Provider, MD  predniSONE (DELTASONE) 20 MG tablet 3 tabs po day one, then 2 po daily x 4 days 05/24/15   Blane Ohara, MD  tamsulosin (FLOMAX) 0.4 MG CAPS capsule Take 0.4 mg by mouth daily after supper.    Historical Provider, MD  tiotropium (SPIRIVA) 18 MCG inhalation capsule Place 1 capsule into inhaler and inhale daily.    Historical Provider, MD   BP 124/72 mmHg  Pulse 72  Temp(Src) 98.1 F (36.7 C) (Oral)  Resp 20  Ht 6' (1.829 m)  Wt 330 lb (149.687 kg)  BMI 44.75 kg/m2  SpO2 99% Physical Exam  1830: Physical examination:  Nursing notes reviewed; Vital signs and O2 SAT reviewed;  Constitutional: Well developed, Well nourished, Well hydrated, In no acute distress; Head:  Normocephalic, atraumatic; Eyes: EOMI, PERRL, No scleral icterus; ENMT: Mouth and pharynx normal, Mucous membranes moist; Neck: Supple, Full range of motion, No lymphadenopathy; Cardiovascular: Regular rate and rhythm, No gallop; Respiratory: Breath sounds clear & equal bilaterally, No wheezes.  Speaking full sentences with ease, Normal respiratory effort/excursion; Chest: Nontender, Movement normal; Abdomen: Soft, large. +very mild diffuse tenderness to palp. Nondistended, Normal bowel sounds. Rectal exam performed w/permission of pt and ED RN chaperone present.  Anal tone normal.  Non-tender, soft brown stool in rectal vault, heme positive.  No fissures, no external  hemorrhoids, no palp masses.; Genitourinary: No CVA tenderness; Extremities: Pulses normal, No tenderness, No edema, No calf edema or asymmetry.; Neuro: AA&Ox3, Major CN grossly intact.  Speech clear. No gross focal motor or sensory deficits in extremities.; Skin: Color normal, Warm, Dry.   ED Course  Procedures (including critical care time) Labs Review   Imaging Review  I have personally reviewed and evaluated these images and lab results as part of my medical decision-making.   EKG Interpretation None      MDM  MDM Reviewed: previous chart, nursing note and vitals Reviewed previous: labs Interpretation: labs and CT scan      Results for orders placed or performed during the hospital encounter of 05/26/15  Comprehensive metabolic panel  Result Value Ref Range   Sodium 142 135 - 145 mmol/L   Potassium 3.9 3.5 - 5.1 mmol/L   Chloride 111 101 - 111 mmol/L   CO2 21 (L) 22 -  32 mmol/L   Glucose, Bld 105 (H) 65 - 99 mg/dL   BUN 14 6 - 20 mg/dL   Creatinine, Ser 1.61 0.61 - 1.24 mg/dL   Calcium 8.8 (L) 8.9 - 10.3 mg/dL   Total Protein 6.8 6.5 - 8.1 g/dL   Albumin 3.8 3.5 - 5.0 g/dL   AST 13 (L) 15 - 41 U/L   ALT 16 (L) 17 - 63 U/L   Alkaline Phosphatase 118 38 - 126 U/L   Total Bilirubin 0.4 0.3 - 1.2 mg/dL   GFR calc non Af Amer >60 >60 mL/min   GFR calc Af Amer >60 >60 mL/min   Anion gap 10 5 - 15  Lipase, blood  Result Value Ref Range   Lipase 104 (H) 11 - 51 U/L  CBC with Differential  Result Value Ref Range   WBC 11.0 (H) 4.0 - 10.5 K/uL   RBC 5.29 4.22 - 5.81 MIL/uL   Hemoglobin 16.1 13.0 - 17.0 g/dL   HCT 09.6 04.5 - 40.9 %   MCV 90.9 78.0 - 100.0 fL   MCH 30.4 26.0 - 34.0 pg   MCHC 33.5 30.0 - 36.0 g/dL   RDW 81.1 91.4 - 78.2 %   Platelets 232 150 - 400 K/uL   Neutrophils Relative % 56 %   Neutro Abs 6.1 1.7 - 7.7 K/uL   Lymphocytes Relative 32 %   Lymphs Abs 3.6 0.7 - 4.0 K/uL   Monocytes Relative 10 %   Monocytes Absolute 1.1 (H) 0.1 - 1.0 K/uL    Eosinophils Relative 1 %   Eosinophils Absolute 0.1 0.0 - 0.7 K/uL   Basophils Relative 1 %   Basophils Absolute 0.1 0.0 - 0.1 K/uL   Ct Abdomen Pelvis W Contrast 05/26/2015  CLINICAL DATA:  Abdominal pain, rectal bleeding EXAM: CT ABDOMEN AND PELVIS WITH CONTRAST TECHNIQUE: Multidetector CT imaging of the abdomen and pelvis was performed using the standard protocol following bolus administration of intravenous contrast. CONTRAST:  OMNIPAQUE IOHEXOL 300 MG/ML  SOLN COMPARISON:  11/12/2014 FINDINGS: Lower chest:  Lung bases are clear. Hepatobiliary: Liver is within normal limits. Gallbladder is underdistended. No intrahepatic or extrahepatic ductal dilatation. Pancreas: Within normal limits. Spleen: Within normal limits. Adrenals/Urinary Tract: 4.0 x 3.5 cm left adrenal adenoma. Right adrenal gland is within normal limits. 12 mm cyst in the left upper pole (series 2/ image 25). Multiple bilateral renal calculi, measuring up to 8 mm in the medial left upper pole (series 2/ image 31), unchanged. No ureteral or bladder calculi.  No hydronephrosis. Bladder is mildly thick-walled although underdistended. Stomach/Bowel: Stomach is within normal limits. No evidence of bowel obstruction. Normal appendix. Sigmoid diverticulosis, without evidence of diverticulitis. No colonic wall thickening or mass is seen. Vascular/Lymphatic: No evidence of abdominal aortic aneurysm. No suspicious abdominopelvic lymphadenopathy. Reproductive: Prostate is unremarkable. Other: No abdominopelvic ascites. Musculoskeletal: Mild degenerative changes of the visualized thoracolumbar spine. IMPRESSION: No evidence of bowel obstruction.  Normal appendix. Sigmoid diverticulosis, without evidence of diverticulitis. No colonic wall thickening or mass is seen. Stable nephrolithiasis, measuring up to 8 mm in the medial left upper kidney. No ureteral or bladder calculi. No hydronephrosis. 4.0 cm left adrenal adenoma, benign. Electronically Signed    By: Charline Bills M.D.   On: 05/26/2015 21:03     2120:  Pt has tol PO well while in the ED without N/V.  No stooling while in the ED.  Abd benign, VSS. Feels better and wants to go home now. Workup  reassuring; f/u GI MD. Dx and testing d/w pt and family.  Questions answered.  Verb understanding, agreeable to d/c home with outpt f/u.    Samuel Jester, DO 05/28/15 2146

## 2015-06-05 ENCOUNTER — Encounter (INDEPENDENT_AMBULATORY_CARE_PROVIDER_SITE_OTHER): Payer: Self-pay | Admitting: *Deleted

## 2015-06-27 ENCOUNTER — Ambulatory Visit (INDEPENDENT_AMBULATORY_CARE_PROVIDER_SITE_OTHER): Payer: Medicaid Other | Admitting: Internal Medicine

## 2015-06-27 ENCOUNTER — Encounter (INDEPENDENT_AMBULATORY_CARE_PROVIDER_SITE_OTHER): Payer: Self-pay | Admitting: Internal Medicine

## 2015-08-19 ENCOUNTER — Encounter (HOSPITAL_COMMUNITY): Payer: Self-pay | Admitting: Emergency Medicine

## 2015-08-19 ENCOUNTER — Emergency Department (HOSPITAL_COMMUNITY)
Admission: EM | Admit: 2015-08-19 | Discharge: 2015-08-19 | Disposition: A | Discharge status: Home / Self Care | Payer: Medicaid Other | Attending: Emergency Medicine | Admitting: Emergency Medicine

## 2015-08-19 DIAGNOSIS — Z7951 Long term (current) use of inhaled steroids: Secondary | ICD-10-CM | POA: Insufficient documentation

## 2015-08-19 DIAGNOSIS — J449 Chronic obstructive pulmonary disease, unspecified: Secondary | ICD-10-CM | POA: Diagnosis not present

## 2015-08-19 DIAGNOSIS — F17210 Nicotine dependence, cigarettes, uncomplicated: Secondary | ICD-10-CM | POA: Insufficient documentation

## 2015-08-19 DIAGNOSIS — Z79899 Other long term (current) drug therapy: Secondary | ICD-10-CM | POA: Insufficient documentation

## 2015-08-19 DIAGNOSIS — F419 Anxiety disorder, unspecified: Secondary | ICD-10-CM | POA: Diagnosis not present

## 2015-08-19 DIAGNOSIS — M5431 Sciatica, right side: Secondary | ICD-10-CM | POA: Insufficient documentation

## 2015-08-19 DIAGNOSIS — Z7982 Long term (current) use of aspirin: Secondary | ICD-10-CM | POA: Insufficient documentation

## 2015-08-19 DIAGNOSIS — Z9981 Dependence on supplemental oxygen: Secondary | ICD-10-CM | POA: Diagnosis not present

## 2015-08-19 DIAGNOSIS — I4892 Unspecified atrial flutter: Secondary | ICD-10-CM | POA: Diagnosis not present

## 2015-08-19 DIAGNOSIS — G4733 Obstructive sleep apnea (adult) (pediatric): Secondary | ICD-10-CM | POA: Diagnosis not present

## 2015-08-19 DIAGNOSIS — G8929 Other chronic pain: Secondary | ICD-10-CM | POA: Insufficient documentation

## 2015-08-19 DIAGNOSIS — M545 Low back pain: Secondary | ICD-10-CM | POA: Diagnosis present

## 2015-08-19 DIAGNOSIS — Z87448 Personal history of other diseases of urinary system: Secondary | ICD-10-CM | POA: Diagnosis not present

## 2015-08-19 DIAGNOSIS — M5136 Other intervertebral disc degeneration, lumbar region: Secondary | ICD-10-CM | POA: Diagnosis not present

## 2015-08-19 DIAGNOSIS — M109 Gout, unspecified: Secondary | ICD-10-CM | POA: Insufficient documentation

## 2015-08-19 MED ORDER — KETOROLAC TROMETHAMINE 60 MG/2ML IM SOLN
60.0000 mg | Freq: Once | INTRAMUSCULAR | Status: AC
  Administered 2015-08-19: 60 mg via INTRAMUSCULAR
  Filled 2015-08-19: qty 2

## 2015-08-19 MED ORDER — MORPHINE SULFATE (PF) 4 MG/ML IV SOLN
8.0000 mg | Freq: Once | INTRAVENOUS | Status: AC
  Administered 2015-08-19: 8 mg via INTRAMUSCULAR
  Filled 2015-08-19: qty 2

## 2015-08-19 MED ORDER — ONDANSETRON HCL 4 MG PO TABS
4.0000 mg | ORAL_TABLET | Freq: Once | ORAL | Status: AC
  Administered 2015-08-19: 4 mg via ORAL
  Filled 2015-08-19: qty 1

## 2015-08-19 MED ORDER — DIAZEPAM 5 MG PO TABS
10.0000 mg | ORAL_TABLET | Freq: Once | ORAL | Status: AC
  Administered 2015-08-19: 10 mg via ORAL
  Filled 2015-08-19: qty 2

## 2015-08-19 NOTE — Discharge Instructions (Signed)
Your examination favors an exacerbation of the degenerative disc disease of your lumbar area, and sciatica. I've spoken with your physician team at [facility]. Please call them today to set up a follow-up appointment. You were treated in the emergency department with muscle relaxers and intramuscular narcotic pain medication. Please use extreme caution getting around today. Sciatica Sciatica is pain, weakness, numbness, or tingling along the path of the sciatic nerve. The nerve starts in the lower back and runs down the back of each leg. The nerve controls the muscles in the lower leg and in the back of the knee, while also providing sensation to the back of the thigh, lower leg, and the sole of your foot. Sciatica is a symptom of another medical condition. For instance, nerve damage or certain conditions, such as a herniated disk or bone spur on the spine, pinch or put pressure on the sciatic nerve. This causes the pain, weakness, or other sensations normally associated with sciatica. Generally, sciatica only affects one side of the body. CAUSES   Herniated or slipped disc.  Degenerative disk disease.  A pain disorder involving the narrow muscle in the buttocks (piriformis syndrome).  Pelvic injury or fracture.  Pregnancy.  Tumor (rare). SYMPTOMS  Symptoms can vary from mild to very severe. The symptoms usually travel from the low back to the buttocks and down the back of the leg. Symptoms can include:  Mild tingling or dull aches in the lower back, leg, or hip.  Numbness in the back of the calf or sole of the foot.  Burning sensations in the lower back, leg, or hip.  Sharp pains in the lower back, leg, or hip.  Leg weakness.  Severe back pain inhibiting movement. These symptoms may get worse with coughing, sneezing, laughing, or prolonged sitting or standing. Also, being overweight may worsen symptoms. DIAGNOSIS  Your caregiver will perform a physical exam to look for  common symptoms of sciatica. He or she may ask you to do certain movements or activities that would trigger sciatic nerve pain. Other tests may be performed to find the cause of the sciatica. These may include:  Blood tests.  X-rays.  Imaging tests, such as an MRI or CT scan. TREATMENT  Treatment is directed at the cause of the sciatic pain. Sometimes, treatment is not necessary and the pain and discomfort goes away on its own. If treatment is needed, your caregiver may suggest:  Over-the-counter medicines to relieve pain.  Prescription medicines, such as anti-inflammatory medicine, muscle relaxants, or narcotics.  Applying heat or ice to the painful area.  Steroid injections to lessen pain, irritation, and inflammation around the nerve.  Reducing activity during periods of pain.  Exercising and stretching to strengthen your abdomen and improve flexibility of your spine. Your caregiver may suggest losing weight if the extra weight makes the back pain worse.  Physical therapy.  Surgery to eliminate what is pressing or pinching the nerve, such as a bone spur or part of a herniated disk. HOME CARE INSTRUCTIONS   Only take over-the-counter or prescription medicines for pain or discomfort as directed by your caregiver.  Apply ice to the affected area for 20 minutes, 3-4 times a day for the first 48-72 hours. Then try heat in the same way.  Exercise, stretch, or perform your usual activities if these do not aggravate your pain.  Attend physical therapy sessions as directed by your caregiver.  Keep all follow-up appointments as directed by your caregiver.  Do not wear  high heels or shoes that do not provide proper support.  Check your mattress to see if it is too soft. A firm mattress may lessen your pain and discomfort. SEEK IMMEDIATE MEDICAL CARE IF:   You lose control of your bowel or bladder (incontinence).  You have increasing weakness in the lower back, pelvis, buttocks, or  legs.  You have redness or swelling of your back.  You have a burning sensation when you urinate.  You have pain that gets worse when you lie down or awakens you at night.  Your pain is worse than you have experienced in the past.  Your pain is lasting longer than 4 weeks.  You are suddenly losing weight without reason. MAKE SURE YOU:  Understand these instructions.  Will watch your condition.  Will get help right away if you are not doing well or get worse.   This information is not intended to replace advice given to you by your health care provider. Make sure you discuss any questions you have with your health care provider.   Document Released: 06/01/2001 Document Revised: 02/26/2015 Document Reviewed: 10/17/2011 Elsevier Interactive Patient Education [employer]

## 2015-08-19 NOTE — ED Provider Notes (Signed)
CSN: [id number]     Arrival date & time 08/19/15  0908 History   First MD Initiated Contact with Patient 08/19/15 [phone]     Chief Complaint  Patient presents with  . Back Pain     (Consider location/radiation/quality/duration/timing/severity/associated sxs/prior Treatment) HPI Comments: Pt states his foot slide in the bathroom 3 days ago, and he had a bad pain in the lower back since that time. Pain went down right leg right leg, this is not new. No loss of control of bowels or bladder. No frequent falls. Pt has pain standing and moving. He has problem walking.  Using crutches to get around. Pt has had spinal injections for DDD at the lumbar region. He is seen by [facility] for evaluation of his back.  Patient is a 45 y.o. male presenting with back pain. The history is provided by the patient.  Back Pain   Past Medical History  Diagnosis Date  . Anxiety   . Gout   . Atrial flutter (HCC) 2012    Tx with ablation  . Renal disorder   . Chronic back pain   . Lumbar radiculopathy   . COPD (chronic obstructive pulmonary disease) (HCC)   . Asthma   . OSA on CPAP   . DOE (dyspnea on exertion)     chronic   Past Surgical History  Procedure Laterality Date  . Cardaic ablasion    . Adenoidectomy    . Removal of salivary gland      History reviewed. No pertinent family history. Social History  Substance Use Topics  . Smoking status: Current Every Day Smoker -- 0.50 packs/day    Types: Cigarettes  . Smokeless tobacco: None  . Alcohol Use: Yes     Comment: rarely    Review of Systems  Cardiovascular: Positive for palpitations.  Musculoskeletal: Positive for back pain. Negative for neck pain and neck stiffness.  All other systems reviewed and are negative.     Allergies  Effexor  Home Medications   Prior to Admission medications   Medication Sig Start Date End Date Taking? Authorizing Provider  albuterol (PROAIR HFA) 108 (90 BASE) MCG/ACT inhaler Inhale 1-2  puffs into the lungs every 6 (six) hours as needed for wheezing or shortness of breath.   Yes Historical Provider, MD  albuterol (PROVENTIL) (2.5 MG/3ML) 0.083% nebulizer solution Take 2.5 mg by nebulization every 6 (six) hours as needed for wheezing or shortness of breath.   Yes Historical Provider, MD  allopurinol (ZYLOPRIM) 300 MG tablet Take 300 mg by mouth at bedtime.    Yes Historical Provider, MD  aspirin EC 325 MG tablet Take 325 mg by mouth daily.   Yes Historical Provider, MD  atorvastatin (LIPITOR) 20 MG tablet Take 20 mg by mouth daily.   Yes Historical Provider, MD  clonazePAM (KLONOPIN) 0.5 MG tablet Take 0.5 mg by mouth 3 (three) times daily as needed for anxiety.    Yes Historical Provider, MD  fluticasone (FLONASE) 50 MCG/ACT nasal spray Place 1 spray into both nostrils daily as needed for allergies or rhinitis.   Yes Historical Provider, MD  Fluticasone-Salmeterol (ADVAIR) 250-50 MCG/DOSE AEPB Inhale 1 puff into the lungs 2 (two) times daily.   Yes Historical Provider, MD  gabapentin (NEURONTIN) 400 MG capsule Take 800 mg by mouth 3 (three) times daily.   Yes Historical Provider, MD  ibuprofen (ADVIL,MOTRIN) 200 MG tablet Take 200 mg by mouth every 6 (six) hours as needed for mild pain or moderate pain.  Yes Historical Provider, MD  lisinopril (PRINIVIL,ZESTRIL) 10 MG tablet Take 10 mg by mouth every evening.    Yes Historical Provider, MD  metoprolol succinate (TOPROL-XL) 50 MG 24 hr tablet Take 75 mg by mouth daily. Take with or immediately following a meal.   Yes Historical Provider, MD  Morphine-Naltrexone 20-0.8 MG CPCR Take 1 tablet by mouth daily as needed (pain).    Yes Historical Provider, MD  nitroGLYCERIN (NITROSTAT) 0.4 MG SL tablet Place 0.4 mg under the tongue every 5 (five) minutes as needed for chest pain.   Yes Historical Provider, MD  tamsulosin (FLOMAX) 0.4 MG CAPS capsule Take 0.4 mg by mouth daily after supper.   Yes Historical Provider, MD  tiotropium  (SPIRIVA) 18 MCG inhalation capsule Place 1 capsule into inhaler and inhale daily.   Yes Historical Provider, MD  HYDROcodone-acetaminophen (NORCO) 5-325 MG tablet Take 2 tablets by mouth every 4 (four) hours as needed. Patient not taking: Reported on 08/19/2015 05/24/15   Blane Ohara, MD  predniSONE (DELTASONE) 20 MG tablet 3 tabs po day one, then 2 po daily x 4 days Patient not taking: Reported on 08/19/2015 05/24/15   Blane Ohara, MD   BP 143/92 mmHg  Pulse 84  Temp(Src) 98.1 F (36.7 C) (Oral)  Resp 20  Ht 6' (1.829 m)  Wt 154.223 kg  BMI 46.10 kg/m2  SpO2 98% Physical Exam  Constitutional: He is oriented to person, place, and time. He appears well-developed and well-nourished.  Non-toxic appearance.  HENT:  Head: Normocephalic.  Right Ear: Tympanic membrane and external ear normal.  Left Ear: Tympanic membrane and external ear normal.  Eyes: EOM and lids are normal. Pupils are equal, round, and reactive to light.  Neck: Normal range of motion. Neck supple. Carotid bruit is not present.  Cardiovascular: Normal rate, regular rhythm, normal heart sounds, intact distal pulses and normal pulses.   Pulmonary/Chest: Breath sounds normal. No respiratory distress.  Abdominal: Soft. Bowel sounds are normal. There is no tenderness. There is no guarding.  Musculoskeletal:       Lumbar back: He exhibits decreased range of motion, tenderness, pain and spasm.       Back:  Lymphadenopathy:       Head (right side): No submandibular adenopathy present.       Head (left side): No submandibular adenopathy present.    He has no cervical adenopathy.  Neurological: He is alert and oriented to person, place, and time. He has normal strength. No cranial nerve deficit or sensory deficit.  Skin: Skin is warm and dry.  Psychiatric: He has a normal mood and affect. His speech is normal.  Nursing note and vitals reviewed.   ED Course  Procedures (including critical care time) Labs Review Labs  Reviewed - No data to display  Imaging Review No results found. I have personally reviewed and evaluated these images and lab results as part of my medical decision-making.   EKG Interpretation None      MDM  Past vital signs reviewed.  Patient has history of degenerative disc disease. He states his last MRI by [facility] was sometime in 2016. I have reviewed the CT abdomen/pelvis also done in 2016 here at this facility. Patient has evidence of mild degenerative changes present. The examination at this time reveals pain with certain movement and also pain with palpation in the lumbar and the paraspinal area of the lumbar region, and shows evidence of suspected sciatica. The patient has pain with change of position.  He has pain with standing.  Patient will be treated with intramuscular morphine, intramuscular Toradol, oral Valium, and oral anti-medic. I have strongly suggested the patient to contact the [facility] for additional evaluation and management of this issue. No evidence of caudal equina, or other emergency neurological musculoskeletal issue at this time.   I spoke with the staff at [facility] in [place]. Discussed with him the treatments provided here in the emergency department. A will be happy to see the patient in their facility for additional evaluation and for management, as they have the most recent MRI for this patient.    Final diagnoses:  None    **I have reviewed nursing notes, vital signs, and all appropriate lab and imaging results for this patient.Ivery Quale, PA-C 08/19/15 1107  Ivery Quale, PA-C 08/19/15 1126  Blane Ohara, MD 08/19/15 [phone]

## 2015-08-19 NOTE — ED Notes (Signed)
PT c/o lower back pain after bending down giving his kids a bath on Saturday night with pain radiating down right leg. PT states no pain medications at home this am and has home morphine medications for pain and spine specialist in [place]. PT able to bear weight on his legs from w/c to bed.

## 2015-11-11 ENCOUNTER — Encounter (HOSPITAL_COMMUNITY): Payer: Self-pay | Admitting: Emergency Medicine

## 2015-11-11 ENCOUNTER — Emergency Department (HOSPITAL_COMMUNITY): Payer: Medicaid Other

## 2015-11-11 ENCOUNTER — Emergency Department (HOSPITAL_COMMUNITY)
Admission: EM | Admit: 2015-11-11 | Discharge: 2015-11-11 | Disposition: A | Discharge status: Home / Self Care | Payer: Medicaid Other | Attending: Emergency Medicine | Admitting: Emergency Medicine

## 2015-11-11 DIAGNOSIS — J449 Chronic obstructive pulmonary disease, unspecified: Secondary | ICD-10-CM | POA: Diagnosis not present

## 2015-11-11 DIAGNOSIS — F17210 Nicotine dependence, cigarettes, uncomplicated: Secondary | ICD-10-CM | POA: Diagnosis not present

## 2015-11-11 DIAGNOSIS — Z791 Long term (current) use of non-steroidal anti-inflammatories (NSAID): Secondary | ICD-10-CM | POA: Diagnosis not present

## 2015-11-11 DIAGNOSIS — Z79899 Other long term (current) drug therapy: Secondary | ICD-10-CM | POA: Insufficient documentation

## 2015-11-11 DIAGNOSIS — R0602 Shortness of breath: Secondary | ICD-10-CM | POA: Diagnosis present

## 2015-11-11 DIAGNOSIS — J209 Acute bronchitis, unspecified: Secondary | ICD-10-CM | POA: Diagnosis not present

## 2015-11-11 DIAGNOSIS — M109 Gout, unspecified: Secondary | ICD-10-CM | POA: Insufficient documentation

## 2015-11-11 DIAGNOSIS — Z7982 Long term (current) use of aspirin: Secondary | ICD-10-CM | POA: Diagnosis not present

## 2015-11-11 HISTORY — DX: Embolism and thrombosis of superficial veins of unspecified lower extremity: I82.819

## 2015-11-11 LAB — BASIC METABOLIC PANEL
Anion gap: 6 (ref 5–15)
BUN: 14 mg/dL (ref 6–20)
CALCIUM: 9.2 mg/dL (ref 8.9–10.3)
CO2: 27 mmol/L (ref 22–32)
Chloride: 108 mmol/L (ref 101–111)
Creatinine, Ser: 1.27 mg/dL — ABNORMAL HIGH (ref 0.61–1.24)
GFR calc Af Amer: >60 mL/min (ref >60)
Glucose, Bld: 91 mg/dL (ref 65–99)
POTASSIUM: 4.2 mmol/L (ref 3.5–5.1)
Sodium: 141 mmol/L (ref 135–145)

## 2015-11-11 LAB — TROPONIN I

## 2015-11-11 LAB — CBC
HEMATOCRIT: 45.8 % (ref 39.0–52.0)
HEMOGLOBIN: 15.4 g/dL (ref 13.0–17.0)
MCH: 30.1 pg (ref 26.0–34.0)
MCHC: 33.6 g/dL (ref 30.0–36.0)
MCV: 89.5 fL (ref 78.0–100.0)
Platelets: 235 10*3/uL (ref 150–400)
RBC: 5.12 MIL/uL (ref 4.22–5.81)
RDW: 13.1 % (ref 11.5–15.5)
WBC: 12.4 10*3/uL — AB (ref 4.0–10.5)

## 2015-11-11 LAB — BRAIN NATRIURETIC PEPTIDE: B Natriuretic Peptide: 387 pg/mL — ABNORMAL HIGH (ref 0.0–100.0)

## 2015-11-11 MED ORDER — PREDNISONE 50 MG PO TABS
60.0000 mg | ORAL_TABLET | Freq: Once | ORAL | Status: AC
  Administered 2015-11-11: 60 mg via ORAL
  Filled 2015-11-11: qty 1

## 2015-11-11 MED ORDER — PREDNISONE 50 MG PO TABS: 50.0000 mg | ORAL_TABLET | Freq: Once | ORAL | Status: DC

## 2015-11-11 MED ORDER — IPRATROPIUM-ALBUTEROL 0.5-2.5 (3) MG/3ML IN SOLN
3.0000 mL | Freq: Once | RESPIRATORY_TRACT | Status: AC
  Administered 2015-11-11: 3 mL via RESPIRATORY_TRACT
  Filled 2015-11-11: qty 3

## 2015-11-11 MED ORDER — OXYCODONE-ACETAMINOPHEN 5-325 MG PO TABS
1.0000 | ORAL_TABLET | Freq: Once | ORAL | Status: AC
  Administered 2015-11-11: 1 via ORAL
  Filled 2015-11-11: qty 1

## 2015-11-11 MED ORDER — OXYCODONE-ACETAMINOPHEN 5-325 MG PO TABS: 1.0000 | ORAL_TABLET | Freq: Four times a day (QID) | ORAL | Status: DC | PRN

## 2015-11-11 MED ORDER — TIOTROPIUM BROMIDE MONOHYDRATE 18 MCG IN CAPS: 1.0000 | ORAL_CAPSULE | Freq: Every day | RESPIRATORY_TRACT | Status: DC

## 2015-11-11 NOTE — ED Notes (Signed)
Two attempts made at IV. Both were unsuccessful. Once in the vein, pt IV infiltrated.

## 2015-11-11 NOTE — ED Notes (Signed)
Patient complaining of shortness of breath "for over a week" with swelling in left lower extremity starting yesterday. States he has not been taking his medication for approximately 2 months "because the medicine makes me feel so bad and I was trying to get in to see my doctor and get reevaluated first." [facility]."

## 2015-11-11 NOTE — ED Provider Notes (Signed)
CSN: [id number]     Arrival date & time 11/11/15  1326 History  First MD Initiated Contact with Patient 11/11/15 1352     Chief Complaint  Patient presents with  . Shortness of Breath  . Leg Swelling   HPI Pt complains of shortness of breath that started over a week ago.    Patient has a history of COPD and atrial flutter.  He also has history of gouty arthritis. Patient states he has not taken his medications for the past 2 months. He was not feeling well on his prior medications he stopped taking them on his own. The patient's chronic smoker. He has been increasingly short of breath over this past week. He denies any chest pain. He has been coughing. He also noticed some swelling in his left knee although it does not involve his lower legs. It hurts to move his knee and put any weight on it. He denies any fevers or chills. Patient has history of superficial thrombosis but no history of DVT or PE.  Past Medical History  Diagnosis Date  . Anxiety   . Gout   . Atrial flutter (HCC) 2012    Tx with ablation  . Renal disorder   . Chronic back pain   . Lumbar radiculopathy   . COPD (chronic obstructive pulmonary disease) (HCC)   . Asthma   . OSA on CPAP   . DOE (dyspnea on exertion)     chronic  . Superficial thrombosis of leg    Past Surgical History  Procedure Laterality Date  . Cardaic ablasion    . Adenoidectomy    . Removal of salivary gland      History reviewed. No pertinent family history. Social History  Substance Use Topics  . Smoking status: Current Every Day Smoker -- 0.50 packs/day    Types: Cigarettes  . Smokeless tobacco: None  . Alcohol Use: Yes     Comment: rarely    Review of Systems  All other systems reviewed and are negative.     Allergies  Effexor  Home Medications   Prior to Admission medications   Medication Sig Start Date End Date Taking? Authorizing Provider  albuterol (PROAIR HFA) 108 (90 BASE) MCG/ACT inhaler Inhale 1-2 puffs into the  lungs every 6 (six) hours as needed for wheezing or shortness of breath.   Yes Historical Provider, MD  albuterol (PROVENTIL) (2.5 MG/3ML) 0.083% nebulizer solution Take 2.5 mg by nebulization every 6 (six) hours as needed for wheezing or shortness of breath.   Yes Historical Provider, MD  allopurinol (ZYLOPRIM) 300 MG tablet Take 300 mg by mouth at bedtime.    Yes Historical Provider, MD  aspirin EC 325 MG tablet Take 325 mg by mouth daily.   Yes Historical Provider, MD  atorvastatin (LIPITOR) 20 MG tablet Take 20 mg by mouth daily.   Yes Historical Provider, MD  clonazePAM (KLONOPIN) 0.5 MG tablet Take 0.5 mg by mouth 3 (three) times daily as needed for anxiety.    Yes Historical Provider, MD  fluticasone (FLONASE) 50 MCG/ACT nasal spray Place 1 spray into both nostrils daily as needed for allergies or rhinitis.   Yes Historical Provider, MD  Fluticasone-Salmeterol (ADVAIR) 250-50 MCG/DOSE AEPB Inhale 1 puff into the lungs 2 (two) times daily.   Yes Historical Provider, MD  gabapentin (NEURONTIN) 400 MG capsule Take 800 mg by mouth 3 (three) times daily.   Yes Historical Provider, MD  ibuprofen (ADVIL,MOTRIN) 200 MG tablet Take 200 mg by  mouth every 6 (six) hours as needed for mild pain or moderate pain.    Yes Historical Provider, MD  lisinopril (PRINIVIL,ZESTRIL) 10 MG tablet Take 10 mg by mouth every evening.    Yes Historical Provider, MD  metoprolol succinate (TOPROL-XL) 50 MG 24 hr tablet Take 75 mg by mouth daily. Take with or immediately following a meal.   Yes Historical Provider, MD  Morphine-Naltrexone 20-0.8 MG CPCR Take 1 tablet by mouth daily as needed (pain).    Yes Historical Provider, MD  nitroGLYCERIN (NITROSTAT) 0.4 MG SL tablet Place 0.4 mg under the tongue every 5 (five) minutes as needed for chest pain.   Yes Historical Provider, MD  tamsulosin (FLOMAX) 0.4 MG CAPS capsule Take 0.4 mg by mouth daily after supper.   Yes Historical Provider, MD  tiotropium (SPIRIVA) 18 MCG  inhalation capsule Place 1 capsule into inhaler and inhale daily.   Yes Historical Provider, MD  oxyCODONE-acetaminophen (PERCOCET/ROXICET) 5-325 MG tablet Take 1 tablet by mouth every 6 (six) hours as needed for severe pain. 11/11/15   Linwood DibblesJon Kaikoa Magro, MD  predniSONE (DELTASONE) 50 MG tablet Take 1 tablet (50 mg total) by mouth once. 11/11/15   Linwood DibblesJon Caydance Kuehnle, MD   BP 135/88 mmHg  Pulse 70  Temp(Src) 97.8 F (36.6 C) (Temporal)  Resp 17  Ht 6' (1.829 m)  Wt 151.955 kg  BMI 45.42 kg/m2  SpO2 97% Physical Exam  Constitutional: No distress.  Overweight  HENT:  Head: Normocephalic and atraumatic.  Right Ear: External ear normal.  Left Ear: External ear normal.  Eyes: Conjunctivae are normal. Right eye exhibits no discharge. Left eye exhibits no discharge. No scleral icterus.  Neck: Neck supple. No tracheal deviation present.  Cardiovascular: Normal rate, regular rhythm and intact distal pulses.   Pulmonary/Chest: Effort normal. No stridor. No respiratory distress. He has wheezes. He has no rales.  Abdominal: Soft. Bowel sounds are normal. He exhibits no distension. There is no tenderness. There is no rebound and no guarding.  Musculoskeletal: He exhibits no edema.       Left knee: He exhibits effusion. Tenderness found.  Effusion noted on the left knee, no erythema, no calf tenderness or swelling bilaterally, normal peripheral pulses  Neurological: He is alert. He has normal strength. No cranial nerve deficit (no facial droop, extraocular movements intact, no slurred speech) or sensory deficit. He exhibits normal muscle tone. He displays no seizure activity. Coordination normal.  Skin: Skin is warm and dry. No rash noted.  Psychiatric: He has a normal mood and affect.  Nursing note and vitals reviewed.   ED Course  Procedures (including critical care time) Labs Review Labs Reviewed  BASIC METABOLIC PANEL - Abnormal; Notable for the following:    Creatinine, Ser 1.27 (*)    All other  components within normal limits  CBC - Abnormal; Notable for the following:    WBC 12.4 (*)    All other components within normal limits  BRAIN NATRIURETIC PEPTIDE - Abnormal; Notable for the following:    B Natriuretic Peptide 387.0 (*)    All other components within normal limits  TROPONIN I    Imaging Review Dg Chest 2 View  11/11/2015  CLINICAL DATA:  Shortness of breath for over week, swelling LEFT lower extremity beginning yesterday, smoker, asthma, COPD EXAM: CHEST  2 VIEW COMPARISON:  04/07/2015 FINDINGS: Normal heart size, mediastinal contours, and pulmonary vascularity. Mild bronchitic changes. No pulmonary infiltrate, pleural effusion or pneumothorax. Bones unremarkable. IMPRESSION: Chronic bronchitic changes without  infiltrate. Electronically Signed   By: Ulyses Southward M.D.   On: 11/11/2015 13:57   Dg Knee Complete 4 Views Left  11/11/2015  CLINICAL DATA:  Severe knee pain and swelling since yesterday, no known injury, history gout EXAM: LEFT KNEE - COMPLETE 4+ VIEW COMPARISON:  04/07/2015 FINDINGS: Osseous mineralization normal. Joint spaces preserved. Bipartite patella again identified. No acute fracture, dislocation, or bone destruction. Linear lucency projects over the fibular head on single view, not on remaining views, suspect superimposed fat plane. No joint effusion. IMPRESSION: No definite acute osseous abnormalities. Electronically Signed   By: Ulyses Southward M.D.   On: 11/11/2015 15:03   I have personally reviewed and evaluated these images and lab results as part of my medical decision-making.   EKG Interpretation   Date/Time:  Tuesday Nov 11 2015 13:37:31 EDT Ventricular Rate:  66 PR Interval:  148 QRS Duration: 88 QT Interval:  380 QTC Calculation: 398 R Axis:   66 Text Interpretation:  Normal sinus rhythm Normal ECG No significant change  since last tracing Confirmed by Adabella Stanis  MD-J, Jawaun Celmer ([id number]) on 11/11/2015  2:08:26 PM      MDM   Final diagnoses:   Bronchitis with bronchospasm  Acute gout of left knee, unspecified cause    Pt's resp sx are related to bronchitis with bronchospasm.  Doubt pna.  CXR does not suggest pulmonary edema.  Labs increased in BNP but I think this is related to his medical noncompliance rather than an acute CHF exacerbation.  Pt will restart his BP meds.  Will rx albuterol and prednisone.  Discussed smoking cessation  Knee xray is normal although clinically he appears to have some edema.  Suspect small effusion.  Doubt acute infection.  He has history of gout.  Prednisone should help with the gout flare.  Follow up with PCP    Linwood Dibbles, MD 11/11/15 [phone]

## 2015-11-11 NOTE — ED Notes (Signed)
Pt reports has crutches at home. Pt denies wanting crutches at this time.

## 2015-11-11 NOTE — Discharge Instructions (Signed)
Gout Gout is when your joints become red, sore, and swell (inflamed). This is caused by the buildup of uric acid crystals in the joints. Uric acid is a chemical that is normally in the blood. If the level of uric acid gets too high in the blood, these crystals form in your joints and tissues. Over time, these crystals can form into masses near the joints and tissues. These masses can destroy bone and cause the bone to look misshapen (deformed). HOME CARE   Do not take aspirin for pain.  Only take medicine as told by your doctor.  Rest the joint as much as you can. When in bed, keep sheets and blankets off painful areas.  Keep the sore joints raised (elevated).  Put warm or cold packs on painful joints. Use of warm or cold packs depends on which works best for you.  Use crutches if the painful joint is in your leg.  Drink enough fluids to keep your pee (urine) clear or pale yellow. Limit alcohol, sugary drinks, and drinks with fructose in them.  Follow your diet instructions. Pay careful attention to how much protein you eat. Include fruits, vegetables, whole grains, and fat-free or low-fat milk products in your daily diet. Talk to your doctor or dietitian about the use of coffee, vitamin C, and cherries. These may help lower uric acid levels.  Keep a healthy body weight. GET HELP RIGHT AWAY IF:   You have watery poop (diarrhea), throw up (vomit), or have any side effects from medicines.  You do not feel better in 24 hours, or you are getting worse.  Your joint becomes suddenly more tender, and you have chills or a fever. MAKE SURE YOU:   Understand these instructions.  Will watch your condition.  Will get help right away if you are not doing well or get worse.   This information is not intended to replace advice given to you by your health care provider. Make sure you discuss any questions you have with your health care provider.   Document Released: 03/16/2008 Document Revised:  06/28/2014 Document Reviewed: 01/19/2012 Elsevier Interactive Patient Education 2016 [employer] Acute Bronchitis Bronchitis is inflammation of the airways that extend from the windpipe into the lungs (bronchi). The inflammation often causes mucus to develop. This leads to a cough, which is the most common symptom of bronchitis.  In acute bronchitis, the condition usually develops suddenly and goes away over time, usually in a couple weeks. Smoking, allergies, and asthma can make bronchitis worse. Repeated episodes of bronchitis may cause further lung problems.  CAUSES Acute bronchitis is most often caused by the same virus that causes a cold. The virus can spread from person to person (contagious) through coughing, sneezing, and touching contaminated objects. SIGNS AND SYMPTOMS   Cough.   Fever.   Coughing up mucus.   Body aches.   Chest congestion.   Chills.   Shortness of breath.   Sore throat.  DIAGNOSIS  Acute bronchitis is usually diagnosed through a physical exam. Your health care provider will also ask you questions about your medical history. Tests, such as chest X-rays, are sometimes done to rule out other conditions.  TREATMENT  Acute bronchitis usually goes away in a couple weeks. Oftentimes, no medical treatment is necessary. Medicines are sometimes given for relief of fever or cough. Antibiotic medicines are usually not needed but may be prescribed in certain situations. In some cases, an inhaler may be recommended to help reduce shortness of breath  and control the cough. A cool mist vaporizer may also be used to help thin bronchial secretions and make it easier to clear the chest.  HOME CARE INSTRUCTIONS  Get plenty of rest.   Drink enough fluids to keep your urine clear or pale yellow (unless you have a medical condition that requires fluid restriction). Increasing fluids may help thin your respiratory secretions (sputum) and reduce chest congestion, and it  will prevent dehydration.   Take medicines only as directed by your health care provider.  If you were prescribed an antibiotic medicine, finish it all even if you start to feel better.  Avoid smoking and secondhand smoke. Exposure to cigarette smoke or irritating chemicals will make bronchitis worse. If you are a smoker, consider using nicotine gum or skin patches to help control withdrawal symptoms. Quitting smoking will help your lungs heal faster.   Reduce the chances of another bout of acute bronchitis by washing your hands frequently, avoiding people with cold symptoms, and trying not to touch your hands to your mouth, nose, or eyes.   Keep all follow-up visits as directed by your health care provider.  SEEK MEDICAL CARE IF: Your symptoms do not improve after 1 week of treatment.  SEEK IMMEDIATE MEDICAL CARE IF:  You develop an increased fever or chills.   You have chest pain.   You have severe shortness of breath.  You have bloody sputum.   You develop dehydration.  You faint or repeatedly feel like you are going to pass out.  You develop repeated vomiting.  You develop a severe headache. MAKE SURE YOU:   Understand these instructions.  Will watch your condition.  Will get help right away if you are not doing well or get worse.   This information is not intended to replace advice given to you by your health care provider. Make sure you discuss any questions you have with your health care provider.   Document Released: 07/15/2004 Document Revised: 06/28/2014 Document Reviewed: 11/28/2012 Elsevier Interactive Patient Education [employer]

## 2016-02-15 ENCOUNTER — Emergency Department (HOSPITAL_COMMUNITY): Payer: Medicaid Other

## 2016-02-15 ENCOUNTER — Encounter (HOSPITAL_COMMUNITY): Payer: Self-pay | Admitting: Emergency Medicine

## 2016-02-15 ENCOUNTER — Emergency Department (HOSPITAL_COMMUNITY)
Admission: EM | Admit: 2016-02-15 | Discharge: 2016-02-15 | Disposition: A | Discharge status: Home / Self Care | Payer: Medicaid Other | Attending: Emergency Medicine | Admitting: Emergency Medicine

## 2016-02-15 DIAGNOSIS — F17210 Nicotine dependence, cigarettes, uncomplicated: Secondary | ICD-10-CM | POA: Diagnosis not present

## 2016-02-15 DIAGNOSIS — R1013 Epigastric pain: Secondary | ICD-10-CM | POA: Diagnosis present

## 2016-02-15 DIAGNOSIS — N200 Calculus of kidney: Secondary | ICD-10-CM | POA: Insufficient documentation

## 2016-02-15 DIAGNOSIS — J449 Chronic obstructive pulmonary disease, unspecified: Secondary | ICD-10-CM | POA: Diagnosis not present

## 2016-02-15 DIAGNOSIS — Z79899 Other long term (current) drug therapy: Secondary | ICD-10-CM | POA: Insufficient documentation

## 2016-02-15 DIAGNOSIS — R1011 Right upper quadrant pain: Secondary | ICD-10-CM

## 2016-02-15 DIAGNOSIS — J45909 Unspecified asthma, uncomplicated: Secondary | ICD-10-CM | POA: Diagnosis not present

## 2016-02-15 DIAGNOSIS — Z7982 Long term (current) use of aspirin: Secondary | ICD-10-CM | POA: Insufficient documentation

## 2016-02-15 LAB — COMPREHENSIVE METABOLIC PANEL
ALK PHOS: 130 U/L — AB (ref 38–126)
ALT: 27 U/L (ref 17–63)
ANION GAP: 6 (ref 5–15)
AST: 24 U/L (ref 15–41)
Albumin: 3.8 g/dL (ref 3.5–5.0)
BILIRUBIN TOTAL: 0.4 mg/dL (ref 0.3–1.2)
BUN: 16 mg/dL (ref 6–20)
CALCIUM: 8.4 mg/dL — AB (ref 8.9–10.3)
CO2: 24 mmol/L (ref 22–32)
CREATININE: 1.25 mg/dL — AB (ref 0.61–1.24)
Chloride: 109 mmol/L (ref 101–111)
Glucose, Bld: 130 mg/dL — ABNORMAL HIGH (ref 65–99)
Potassium: 4.3 mmol/L (ref 3.5–5.1)
SODIUM: 139 mmol/L (ref 135–145)
TOTAL PROTEIN: 6.8 g/dL (ref 6.5–8.1)

## 2016-02-15 LAB — DIFFERENTIAL
BASOS ABS: 0.1 10*3/uL (ref 0.0–0.1)
Basophils Relative: 0 %
EOS ABS: 0.2 10*3/uL (ref 0.0–0.7)
EOS PCT: 2 %
LYMPHS PCT: 16 %
Lymphs Abs: 1.8 10*3/uL (ref 0.7–4.0)
Monocytes Absolute: 0.5 10*3/uL (ref 0.1–1.0)
Monocytes Relative: 5 %
NEUTROS PCT: 77 %
Neutro Abs: 8.9 10*3/uL — ABNORMAL HIGH (ref 1.7–7.7)

## 2016-02-15 LAB — CBC
HCT: 45.9 % (ref 39.0–52.0)
HEMOGLOBIN: 15.8 g/dL (ref 13.0–17.0)
MCH: 30.9 pg (ref 26.0–34.0)
MCHC: 34.4 g/dL (ref 30.0–36.0)
MCV: 89.6 fL (ref 78.0–100.0)
PLATELETS: 216 10*3/uL (ref 150–400)
RBC: 5.12 MIL/uL (ref 4.22–5.81)
RDW: 13 % (ref 11.5–15.5)
WBC: 11.4 10*3/uL — AB (ref 4.0–10.5)

## 2016-02-15 LAB — URINALYSIS, ROUTINE W REFLEX MICROSCOPIC
BILIRUBIN URINE: NEGATIVE
Glucose, UA: NEGATIVE mg/dL
Ketones, ur: NEGATIVE mg/dL
Leukocytes, UA: NEGATIVE
NITRITE: NEGATIVE
PROTEIN: NEGATIVE mg/dL
SPECIFIC GRAVITY, URINE: 1.02 (ref 1.005–1.030)
pH: 5.5 (ref 5.0–8.0)

## 2016-02-15 LAB — URINE MICROSCOPIC-ADD ON

## 2016-02-15 LAB — LIPASE, BLOOD: Lipase: 38 U/L (ref 11–51)

## 2016-02-15 MED ORDER — METOCLOPRAMIDE HCL 5 MG/ML IJ SOLN
10.0000 mg | Freq: Once | INTRAMUSCULAR | Status: AC
  Administered 2016-02-15: 10 mg via INTRAVENOUS
  Filled 2016-02-15: qty 2

## 2016-02-15 MED ORDER — ONDANSETRON HCL 4 MG/2ML IJ SOLN
8.0000 mg | Freq: Once | INTRAMUSCULAR | Status: AC
  Administered 2016-02-15: 8 mg via INTRAVENOUS
  Filled 2016-02-15: qty 4

## 2016-02-15 MED ORDER — HYDROMORPHONE HCL 1 MG/ML IJ SOLN
1.0000 mg | Freq: Once | INTRAMUSCULAR | Status: AC
  Administered 2016-02-15: 1 mg via INTRAVENOUS
  Filled 2016-02-15: qty 1

## 2016-02-15 MED ORDER — ONDANSETRON HCL 4 MG PO TABS: 8.0000 mg | ORAL_TABLET | Freq: Three times a day (TID) | ORAL | 0 refills | Status: DC | PRN

## 2016-02-15 MED ORDER — OXYCODONE-ACETAMINOPHEN 5-325 MG PO TABS: 1.0000 | ORAL_TABLET | Freq: Four times a day (QID) | ORAL | 0 refills | Status: DC | PRN

## 2016-02-15 MED ORDER — HYDROMORPHONE HCL 1 MG/ML IJ SOLN
1.0000 mg | Freq: Once | INTRAMUSCULAR | Status: AC | PRN
  Administered 2016-02-15: 1 mg via INTRAVENOUS
  Filled 2016-02-15: qty 1

## 2016-02-15 NOTE — ED Provider Notes (Signed)
[facility]Y DEPT Provider Note   CSN: [id number] Arrival date & time: 02/15/16  6962     History   Chief Complaint Chief Complaint  Patient presents with  . Abdominal Pain    HPI Darrell Lindsey is a 45 y.o. male.  HPI 45 year old male who presents abdominal pain. Woke up at 4 AM with severe middle and epigastric abdominal pain. Subsequently with multiple episodes of vomiting. Has had mulitple bowel movements without diarrhea. Pain migrated to the right upper abdomen and flank. Feels like he has burning with urination. No hematuria. No hematochezia or melena. Feeling feverish, sweaty and with cold chills. No similar symptoms in the prior. History of nephrolithiasis but this feels different. Started indomethacin recently for gout.  Past Medical History:  Diagnosis Date  . Anxiety   . Asthma   . Atrial flutter (HCC) 2012   Tx with ablation  . Chronic back pain   . COPD (chronic obstructive pulmonary disease) (HCC)   . DOE (dyspnea on exertion)    chronic  . Gout   . Lumbar radiculopathy   . OSA on CPAP   . Renal disorder    kidney stones  . Superficial thrombosis of leg     There are no active problems to display for this patient.   Past Surgical History:  Procedure Laterality Date  . ADENOIDECTOMY    . cardaic ablasion    . removal of salivary gland          Home Medications    Prior to Admission medications   Medication Sig Start Date End Date Taking? Authorizing Provider  albuterol (PROAIR HFA) 108 (90 BASE) MCG/ACT inhaler Inhale 1-2 puffs into the lungs every 6 (six) hours as needed for wheezing or shortness of breath.   Yes Historical Provider, MD  albuterol (PROVENTIL) (2.5 MG/3ML) 0.083% nebulizer solution Take 2.5 mg by nebulization every 6 (six) hours as needed for wheezing or shortness of breath.   Yes Historical Provider, MD  allopurinol (ZYLOPRIM) 300 MG tablet Take 300 mg by mouth at bedtime.    Yes Historical Provider, MD  aspirin EC 325 MG  tablet Take 325 mg by mouth daily.   Yes Historical Provider, MD  atorvastatin (LIPITOR) 20 MG tablet Take 20 mg by mouth daily.   Yes Historical Provider, MD  fluticasone (FLONASE) 50 MCG/ACT nasal spray Place 1 spray into both nostrils daily as needed for allergies or rhinitis.   Yes Historical Provider, MD  Fluticasone-Salmeterol (ADVAIR) 250-50 MCG/DOSE AEPB Inhale 1 puff into the lungs 2 (two) times daily.   Yes Historical Provider, MD  gabapentin (NEURONTIN) 400 MG capsule Take 800 mg by mouth 3 (three) times daily.   Yes Historical Provider, MD  ibuprofen (ADVIL,MOTRIN) 200 MG tablet Take 200 mg by mouth every 6 (six) hours as needed for mild pain or moderate pain.    Yes Historical Provider, MD  indomethacin (INDOCIN SR) 75 MG CR capsule Take 75 mg by mouth 3 (three) times daily as needed (for gout).   Yes Historical Provider, MD  lisinopril (PRINIVIL,ZESTRIL) 10 MG tablet Take 10 mg by mouth every evening.    Yes Historical Provider, MD  metoprolol succinate (TOPROL-XL) 50 MG 24 hr tablet Take 75 mg by mouth daily. Take with or immediately following a meal.   Yes Historical Provider, MD  tamsulosin (FLOMAX) 0.4 MG CAPS capsule Take 0.4 mg by mouth daily after supper.   Yes Historical Provider, MD  tiotropium (SPIRIVA) 18 MCG inhalation  capsule Place 1 capsule (18 mcg total) into inhaler and inhale daily. 11/11/15  Yes Linwood Dibbles, MD  ondansetron (ZOFRAN) 4 MG tablet Take 2 tablets (8 mg total) by mouth every 8 (eight) hours as needed for nausea or vomiting. 02/15/16   Lavera Guise, MD  oxyCODONE-acetaminophen (PERCOCET/ROXICET) 5-325 MG tablet Take 1 tablet by mouth every 6 (six) hours as needed for severe pain. Patient not taking: Reported on 02/15/2016 11/11/15   Linwood Dibbles, MD  oxyCODONE-acetaminophen (ROXICET) 5-325 MG tablet Take 1-2 tablets by mouth every 6 (six) hours as needed for severe pain. 02/15/16   Lavera Guise, MD  predniSONE (DELTASONE) 50 MG tablet Take 1 tablet (50 mg total) by  mouth once. Patient not taking: Reported on 02/15/2016 11/11/15   Linwood Dibbles, MD    Family History History reviewed. No pertinent family history.  Social History Social History  Substance Use Topics  . Smoking status: Current Every Day Smoker    Packs/day: 0.50    Types: Cigarettes  . Smokeless tobacco: Not on file  . Alcohol use Yes     Comment: rarely     Allergies   Effexor [venlafaxine hcl]   Review of Systems Review of Systems 10/14 systems reviewed and are negative other than those stated in the HPI   Physical Exam Updated Vital Signs BP 123/73   Pulse 63   Temp 97.7 F (36.5 C) (Oral)   Resp 18   Ht 6' (1.829 m)   Wt (!) 330 lb (149.7 kg)   SpO2 94%   BMI 44.76 kg/m   Physical Exam  Physical Exam  Nursing note and vitals reviewed. Constitutional: appears uncomfortable Head: Normocephalic and atraumatic.  Mouth/Throat: Oropharynx is clear and moist.  Neck: Normal range of motion. Neck supple.  Cardiovascular: Normal rate and regular rhythm.   Pulmonary/Chest: Effort normal and breath sounds normal.  Abdominal: Soft. There is epigastric and RUQ and mild right mid and lower quadrant tenderness. There is no rebound and no guarding. Mild right flank pain.  Musculoskeletal: Normal range of motion.  Neurological: Alert, no facial droop, fluent speech, moves all extremities symmetrically Skin: Skin is warm and dry.  Psychiatric: Cooperative  ED Treatments / Results  Labs (all labs ordered are listed, but only abnormal results are displayed) Labs Reviewed  COMPREHENSIVE METABOLIC PANEL - Abnormal; Notable for the following:       Result Value   Glucose, Bld 130 (*)    Creatinine, Ser 1.25 (*)    Calcium 8.4 (*)    Alkaline Phosphatase 130 (*)    All other components within normal limits  CBC - Abnormal; Notable for the following:    WBC 11.4 (*)    All other components within normal limits  URINALYSIS, ROUTINE W REFLEX MICROSCOPIC (NOT AT [facility] -  Abnormal; Notable for the following:    APPearance HAZY (*)    Hgb urine dipstick LARGE (*)    All other components within normal limits  DIFFERENTIAL - Abnormal; Notable for the following:    Neutro Abs 8.9 (*)    All other components within normal limits  URINE MICROSCOPIC-ADD ON - Abnormal; Notable for the following:    Squamous Epithelial / LPF 0-5 (*)    Bacteria, UA FEW (*)    All other components within normal limits  LIPASE, BLOOD    EKG  EKG Interpretation None       Radiology Ct Abdomen Pelvis Wo Contrast  Result Date: 02/15/2016 CLINICAL DATA:  Right-sided abdominal pain. EXAM: CT ABDOMEN AND PELVIS WITHOUT CONTRAST TECHNIQUE: Multidetector CT imaging of the abdomen and pelvis was performed following the standard protocol without IV contrast. COMPARISON:  CT 05/26/2015 FINDINGS: Lower chest: Lung bases are clear. No effusions. Heart is normal size. Hepatobiliary: Probable diffuse fatty infiltration of the liver. Image quality is degraded by body habitus. Gallbladder grossly unremarkable. Pancreas: No focal abnormality or ductal dilatation. Spleen: No focal abnormality.  Normal size. Adrenals/Urinary Tract: Bilateral nephrolithiasis. No hydronephrosis. There is a punctate calcification layering posteriorly in the bladder, likely recently passed 1-2 mm stone. Urinary bladder is decompressed. Right adrenal gland unremarkable. 3.8 cm lesion within the left adrenal gland which measures 3 Hounsfield units, most compatible with adenoma. This is stable. Stomach/Bowel: Scattered sigmoid and descending colonic diverticulosis. No active diverticulitis. Stomach and small bowel decompressed. Appendix not definitively seen. No inflammatory process in the right lower quadrant. Vascular/Lymphatic: No evidence of aneurysm or adenopathy. Reproductive: No visible focal abnormality. Other: No free fluid or free air. Musculoskeletal: Insert CT small IMPRESSION: Punctate 1-2 mm stone layering  dependently in the bladder, likely recently passed stone. Bilateral nephrolithiasis. No hydronephrosis. Stable left adrenal adenoma. Left colonic diverticulosis. Image quality degraded by body habitus. Electronically Signed   By: Charlett Nose M.D.   On: 02/15/2016 10:23   US Abdomen Limited Ruq  Result Date: 02/15/2016 CLINICAL DATA:  RIGHT upper quadrant pain and RIGHT lower quadrant pain. EXAM: US ABDOMEN LIMITED - RIGHT UPPER QUADRANT COMPARISON:  CT 05/25/2016 FINDINGS: Gallbladder: No gallstones or wall thickening visualized. No sonographic Murphy sign noted by sonographer. Common bile duct: Diameter: Normal at 5 mm Liver: Exam is limited due to body habitus. No duct dilatation evident. Normal echogenicity. Insert on noted of RIGHT renal calculus. IMPRESSION: 1. Normal gallbladder. 2. Limited exam due to patient body habitus. No gross hepatic abnormality. Electronically Signed   By: Genevive Bi M.D.   On: 02/15/2016 09:09    Procedures Procedures (including critical care time)  Medications Ordered in ED Medications  ondansetron (ZOFRAN) injection 8 mg (8 mg Intravenous Given 02/15/16 0759)  HYDROmorphone (DILAUDID) injection 1 mg (1 mg Intravenous Given 02/15/16 0759)  HYDROmorphone (DILAUDID) injection 1 mg (1 mg Intravenous Given 02/15/16 1107)  metoCLOPramide (REGLAN) injection 10 mg (10 mg Intravenous Given 02/15/16 1107)     Initial Impression / Assessment and Plan / ED Course  I have reviewed the triage vital signs and the nursing notes.  Pertinent labs & imaging results that were available during my care of the patient were reviewed by me and considered in my medical decision making (see chart for details).  Clinical Course    Presenting with right upper abdominal pain, nausea, and vomiting. Afebrile and hemodynamically stable. With soft abdomen, RUQ > RLQ tenderness and mild flank tenderness. UA with blood but states this is not typical for his kidney stone pain. Mild  leukocytosis of 11.4. Near baseline creatinine of 1.25. No other major metabolic or electrolyte derangements. RUQ w/ no hepatobiliary disease on ultrasound. Will undergo CT abd/pelvis for evaluation of appendicitis vs kidney stone.  CT with no secondary signs of appendicitis although pain on reexam is not in his right lower quadrant. He does have kidney stone in the bladder suggestive of recently passed stone. Urine does not show infection he is afebrile and do not suspect concurrent pyelonephritis. He will follow-up with his outpatient urologist.  The patient appears reasonably screened and/or stabilized for discharge and I doubt any other medical condition or other  EMC requiring further screening, evaluation, or treatment in the ED at this time prior to discharge. Strict return and follow-up instructions reviewed. He expressed understanding of all discharge instructions and felt comfortable with the plan of care.   Final Clinical Impressions(s) / ED Diagnoses   Final diagnoses:  RUQ abdominal pain  Kidney stones    New Prescriptions New Prescriptions   ONDANSETRON (ZOFRAN) 4 MG TABLET    Take 2 tablets (8 mg total) by mouth every 8 (eight) hours as needed for nausea or vomiting.   OXYCODONE-ACETAMINOPHEN (ROXICET) 5-325 MG TABLET    Take 1-2 tablets by mouth every 6 (six) hours as needed for severe pain.     Lavera Guiseana Duo Marysa Wessner, MD 02/15/16 [phone]

## 2016-02-15 NOTE — ED Triage Notes (Signed)
Pt reports waking up at 4am with pain in abd spreading across right side with n/v.  States he has also had several bowel movements, but not diarrhea.

## 2016-02-15 NOTE — ED Notes (Signed)
Pt verbalized understanding of no driving and to use caution within 4 hours of taking pain meds due to meds cause drowsiness 

## 2016-02-15 NOTE — ED Notes (Signed)
Pt given water to drink. Pt tolerating well at this time. 

## 2016-02-15 NOTE — Discharge Instructions (Signed)
Your CT shows that you likely passed some kidney stones that caused your pain today.  Call your neurologist tomorrow to set up a close follow-up appointment. Return without fail for worsening symptoms, including intractable vomiting despite nausea medications, fever, escalating pain, or any other symptoms concerning to you.

## 2016-02-26 DIAGNOSIS — I4891 Unspecified atrial fibrillation: Secondary | ICD-10-CM | POA: Insufficient documentation

## 2016-02-26 DIAGNOSIS — R0602 Shortness of breath: Secondary | ICD-10-CM | POA: Insufficient documentation

## 2016-05-27 ENCOUNTER — Emergency Department (HOSPITAL_COMMUNITY): Payer: Medicaid Other

## 2016-05-27 ENCOUNTER — Emergency Department (HOSPITAL_COMMUNITY)
Admission: EM | Admit: 2016-05-27 | Discharge: 2016-05-27 | Disposition: A | Discharge status: Home / Self Care | Payer: Medicaid Other | Attending: Emergency Medicine | Admitting: Emergency Medicine

## 2016-05-27 ENCOUNTER — Encounter (HOSPITAL_COMMUNITY): Payer: Self-pay | Admitting: Emergency Medicine

## 2016-05-27 DIAGNOSIS — N201 Calculus of ureter: Secondary | ICD-10-CM | POA: Insufficient documentation

## 2016-05-27 DIAGNOSIS — I509 Heart failure, unspecified: Secondary | ICD-10-CM | POA: Diagnosis not present

## 2016-05-27 DIAGNOSIS — H539 Unspecified visual disturbance: Secondary | ICD-10-CM | POA: Insufficient documentation

## 2016-05-27 DIAGNOSIS — F17210 Nicotine dependence, cigarettes, uncomplicated: Secondary | ICD-10-CM | POA: Insufficient documentation

## 2016-05-27 DIAGNOSIS — Z791 Long term (current) use of non-steroidal anti-inflammatories (NSAID): Secondary | ICD-10-CM | POA: Diagnosis not present

## 2016-05-27 DIAGNOSIS — R05 Cough: Secondary | ICD-10-CM | POA: Insufficient documentation

## 2016-05-27 DIAGNOSIS — J45909 Unspecified asthma, uncomplicated: Secondary | ICD-10-CM | POA: Diagnosis not present

## 2016-05-27 DIAGNOSIS — J449 Chronic obstructive pulmonary disease, unspecified: Secondary | ICD-10-CM | POA: Diagnosis not present

## 2016-05-27 DIAGNOSIS — R0602 Shortness of breath: Secondary | ICD-10-CM | POA: Diagnosis not present

## 2016-05-27 DIAGNOSIS — Z79899 Other long term (current) drug therapy: Secondary | ICD-10-CM | POA: Diagnosis not present

## 2016-05-27 DIAGNOSIS — R21 Rash and other nonspecific skin eruption: Secondary | ICD-10-CM | POA: Diagnosis not present

## 2016-05-27 HISTORY — DX: Heart failure, unspecified: I50.9

## 2016-05-27 LAB — CBC
HEMATOCRIT: 41.8 % (ref 39.0–52.0)
Hemoglobin: 14.2 g/dL (ref 13.0–17.0)
MCH: 30.6 pg (ref 26.0–34.0)
MCHC: 34 g/dL (ref 30.0–36.0)
MCV: 90.1 fL (ref 78.0–100.0)
Platelets: 187 10*3/uL (ref 150–400)
RBC: 4.64 MIL/uL (ref 4.22–5.81)
RDW: 13.2 % (ref 11.5–15.5)
WBC: 8.5 10*3/uL (ref 4.0–10.5)

## 2016-05-27 LAB — BASIC METABOLIC PANEL
Anion gap: 7 (ref 5–15)
BUN: 18 mg/dL (ref 6–20)
CHLORIDE: 109 mmol/L (ref 101–111)
CO2: 23 mmol/L (ref 22–32)
Calcium: 9 mg/dL (ref 8.9–10.3)
Creatinine, Ser: 2.45 mg/dL — ABNORMAL HIGH (ref 0.61–1.24)
GFR calc Af Amer: 35 mL/min — ABNORMAL LOW (ref >60)
GFR calc non Af Amer: 31 mL/min — ABNORMAL LOW (ref >60)
GLUCOSE: 98 mg/dL (ref 65–99)
POTASSIUM: 4.1 mmol/L (ref 3.5–5.1)
Sodium: 139 mmol/L (ref 135–145)

## 2016-05-27 LAB — BRAIN NATRIURETIC PEPTIDE: B Natriuretic Peptide: 218 pg/mL — ABNORMAL HIGH (ref 0.0–100.0)

## 2016-05-27 LAB — TROPONIN I: Troponin I: <0.03 ng/mL (ref <0.03)

## 2016-05-27 MED ORDER — ONDANSETRON HCL 4 MG/2ML IJ SOLN
4.0000 mg | Freq: Once | INTRAMUSCULAR | Status: AC
  Administered 2016-05-27: 4 mg via INTRAVENOUS
  Filled 2016-05-27: qty 2

## 2016-05-27 MED ORDER — OXYCODONE-ACETAMINOPHEN 5-325 MG PO TABS: 1.0000 | ORAL_TABLET | Freq: Four times a day (QID) | ORAL | 0 refills | Status: DC | PRN

## 2016-05-27 MED ORDER — HYDROMORPHONE HCL 1 MG/ML IJ SOLN
1.0000 mg | Freq: Once | INTRAMUSCULAR | Status: AC
  Administered 2016-05-27: 1 mg via INTRAVENOUS
  Filled 2016-05-27: qty 1

## 2016-05-27 NOTE — ED Notes (Signed)
Pt upset he is not being admitted or having surgery. RN explained rationale and work up findings, as well as admission criteria to pt. Pt fussing and cussing. Security at bedside to escort pt out.

## 2016-05-27 NOTE — ED Triage Notes (Signed)
Per EMS- [facility]. [facility].

## 2016-05-27 NOTE — ED Notes (Signed)
Pt calling his urologist office to attempt to move up his follow up appointment which is currently scheduled for 06/18/16.

## 2016-05-27 NOTE — Discharge Instructions (Signed)
Referral locally provided to urology. Renewed your Percocet. Very important to follow-up with urology because of the kidney stones. Chest x-ray negative for any lung findings in your oxygen saturations have remained normal here.

## 2016-05-27 NOTE — ED Provider Notes (Signed)
[facility]-EMERGENCY DEPT Provider Note   CSN: [id number] Arrival date & time: 05/27/16  1322  By signing my name below, I, Darrell Lindsey, attest that this documentation has been prepared under the direction and in the presence of Vanetta MuldersScott Maisha Bogen, MD. Electronically Signed: Placido SouLogan Lindsey, ED Scribe. 05/27/16. 2:06 PM.   History   Chief Complaint Chief Complaint  Patient presents with  . Shortness of Breath    HPI HPI Comments: Darrell Lindsey is a 45 y.o. male with a h/o COPD and chronic DOE who presents to the Emergency Department complaining of worsening, moderate, SOB x 1 day. [facility] kidney [facility] and was d/c yesterday. He states he was supposed to have the kidney stones surgically removed but another hospital wouldn't accept the transfer and he was d/c with the stones still in place. He began experiencing his SOB since being d/c and this morning was at his PCP office and was experiencing dyspnea on exertion and was told he "had crackles in his lungs". He reports an associated productive cough. Pt has a h/o kidney stones and had a lithotripsy performed four weeks ago. [facility]. He denies fevers, chills or other associated symptoms at this time.    The history is provided by the patient and medical records. No language interpreter was used.  Shortness of Breath  This is a recurrent problem. The average episode lasts 1 day. The problem occurs intermittently.Associated symptoms include cough, vomiting, abdominal pain, rash (bilateral feet) and leg swelling. Pertinent negatives include no fever, no headaches, no rhinorrhea, no sore throat and no chest pain.    Past Medical History:  Diagnosis Date  . Anxiety   . Asthma   . Atrial flutter (HCC) 2012   Tx with ablation  . CHF (congestive heart failure) (HCC)   . Chronic back pain   . COPD (chronic obstructive pulmonary disease) (HCC)   . DOE (dyspnea on exertion)    chronic  .  Gout   . Lumbar radiculopathy   . OSA on CPAP   . Renal disorder    kidney stones  . Superficial thrombosis of leg     There are no active problems to display for this patient.   Past Surgical History:  Procedure Laterality Date  . ADENOIDECTOMY    . cardaic ablasion    . removal of salivary gland          Home Medications    Prior to Admission medications   Medication Sig Start Date End Date Taking? Authorizing Provider  albuterol (PROAIR HFA) 108 (90 BASE) MCG/ACT inhaler Inhale 1-2 puffs into the lungs every 6 (six) hours as needed for wheezing or shortness of breath.   Yes Historical Provider, MD  albuterol (PROVENTIL) (2.5 MG/3ML) 0.083% nebulizer solution Take 2.5 mg by nebulization every 6 (six) hours as needed for wheezing or shortness of breath.   Yes Historical Provider, MD  allopurinol (ZYLOPRIM) 300 MG tablet Take 300 mg by mouth at bedtime.    Yes Historical Provider, MD  aspirin EC 325 MG tablet Take 325 mg by mouth daily.   Yes Historical Provider, MD  atorvastatin (LIPITOR) 20 MG tablet Take 20 mg by mouth daily.   Yes Historical Provider, MD  colchicine 0.6 MG tablet Take 0.6 mg by mouth daily.   Yes Historical Provider, MD  fluticasone (FLONASE) 50 MCG/ACT nasal spray Place 1 spray into both nostrils daily as needed for allergies or rhinitis.  Yes Historical Provider, MD  Fluticasone-Salmeterol (ADVAIR) 250-50 MCG/DOSE AEPB Inhale 1 puff into the lungs 2 (two) times daily.   Yes Historical Provider, MD  furosemide (LASIX) 20 MG tablet Take 20 mg by mouth.   Yes Historical Provider, MD  gabapentin (NEURONTIN) 400 MG capsule Take 800 mg by mouth 3 (three) times daily.   Yes Historical Provider, MD  HYDROcodone-acetaminophen (NORCO) 10-325 MG tablet Take 2 tablets by mouth every 6 (six) hours as needed for moderate pain.   Yes Historical Provider, MD  ibuprofen (ADVIL,MOTRIN) 200 MG tablet Take 200 mg by mouth every 6 (six) hours as needed for mild pain or moderate  pain.    Yes Historical Provider, MD  lisinopril (PRINIVIL,ZESTRIL) 10 MG tablet Take 10 mg by mouth every evening.    Yes Historical Provider, MD  metoprolol succinate (TOPROL-XL) 50 MG 24 hr tablet Take 75 mg by mouth daily. Take with or immediately following a meal.   Yes Historical Provider, MD  oxyCODONE-acetaminophen (ROXICET) 5-325 MG tablet Take 1-2 tablets by mouth every 6 (six) hours as needed for severe pain. 02/15/16  Yes Lavera Guise, MD  tamsulosin (FLOMAX) 0.4 MG CAPS capsule Take 0.4 mg by mouth daily after supper.   Yes Historical Provider, MD  tiotropium (SPIRIVA) 18 MCG inhalation capsule Place 1 capsule (18 mcg total) into inhaler and inhale daily. 11/11/15  Yes Linwood Dibbles, MD  ondansetron (ZOFRAN) 4 MG tablet Take 2 tablets (8 mg total) by mouth every 8 (eight) hours as needed for nausea or vomiting. 02/15/16   Lavera Guise, MD  oxyCODONE-acetaminophen (PERCOCET/ROXICET) 5-325 MG tablet Take 1-2 tablets by mouth every 6 (six) hours as needed for severe pain. 05/27/16   Vanetta Mulders, MD  predniSONE (DELTASONE) 50 MG tablet Take 1 tablet (50 mg total) by mouth once. Patient not taking: Reported on 02/15/2016 11/11/15   Linwood Dibbles, MD    Family History History reviewed. No pertinent family history.  Social History Social History  Substance Use Topics  . Smoking status: Current Every Day Smoker    Packs/day: 0.50    Types: Cigarettes  . Smokeless tobacco: Never Used  . Alcohol use Yes     Comment: rarely     Allergies   Effexor [venlafaxine hcl]   Review of Systems Review of Systems  Constitutional: Negative for chills and fever.  HENT: Positive for congestion. Negative for postnasal drip, rhinorrhea, sinus pressure and sore throat.   Eyes: Positive for visual disturbance.  Respiratory: Positive for cough and shortness of breath.   Cardiovascular: Positive for leg swelling. Negative for chest pain.  Gastrointestinal: Positive for abdominal pain, nausea and vomiting.  Negative for diarrhea.  Genitourinary: Positive for difficulty urinating and flank pain. Negative for dysuria and hematuria.  Musculoskeletal: Positive for back pain (lumbar).  Skin: Positive for rash (bilateral feet).  Neurological: Negative for syncope, light-headedness and headaches.  Hematological: Does not bruise/bleed easily.  Psychiatric/Behavioral: Negative for confusion.  All other systems reviewed and are negative.  Physical Exam Updated Vital Signs BP 124/73   Pulse 68   Temp 98.3 F (36.8 C)   Resp 17   Ht 6' (1.829 m)   Wt (!) 161 kg   SpO2 93%   BMI 48.15 kg/m   Physical Exam  Constitutional: He is oriented to person, place, and time. He appears well-developed. No distress.  HENT:  Head: Normocephalic and atraumatic.  Mouth/Throat: Oropharynx is clear and moist and mucous membranes are normal.  Eyes: Conjunctivae  and EOM are normal. Pupils are equal, round, and reactive to light. No scleral icterus.  Neck: Normal range of motion. Neck supple. No tracheal deviation present.  Cardiovascular: Normal rate, regular rhythm, normal heart sounds and intact distal pulses.  Exam reveals no gallop and no friction rub.   No murmur heard. Pulmonary/Chest: Effort normal and breath sounds normal. No respiratory distress. He has no wheezes. He has no rales.  100% on RA during examination   Abdominal: Soft. Bowel sounds are normal. There is no tenderness.  Musculoskeletal: Normal range of motion. He exhibits edema.  Trace pitting edema noted to the bilateral ankles   Neurological: He is alert and oriented to person, place, and time. No cranial nerve deficit. He exhibits normal muscle tone. Coordination normal.  Moves fingers and toes normally   Skin: Skin is warm and dry.  Psychiatric: He has a normal mood and affect. His behavior is normal.  Nursing note and vitals reviewed.  ED Treatments / Results  Labs (all labs ordered are listed, but only abnormal results are  displayed) Labs Reviewed  BASIC METABOLIC PANEL - Abnormal; Notable for the following:       Result Value   Creatinine, Ser 2.45 (*)    GFR calc non Af Amer 31 (*)    GFR calc Af Amer 35 (*)    All other components within normal limits  BRAIN NATRIURETIC PEPTIDE - Abnormal; Notable for the following:    B Natriuretic Peptide 218.0 (*)    All other components within normal limits  CBC  TROPONIN I    EKG  EKG Interpretation  Date/Time:  Thursday May 27 2016 13:30:32 EST Ventricular Rate:  63 PR Interval:    QRS Duration: 96 QT Interval:  402 QTC Calculation: 412 R Axis:   59 Text Interpretation:  Sinus rhythm Low voltage, precordial leads No significant change since last tracing Confirmed by Ahmon Tosi  MD, Yoselyn Mcglade [phone]([id number]) on 05/27/2016 2:18:22 PM       Radiology Dg Chest 2 View  Result Date: 05/27/2016 CLINICAL DATA:  Shortness of breath, bilateral flank pain. Dizziness, nausea. EXAM: CHEST  2 VIEW COMPARISON:  05/23/2016 FINDINGS: Heart and mediastinal contours are within normal limits. No focal opacities or effusions. No acute bony abnormality. IMPRESSION: No active cardiopulmonary disease. Electronically Signed   By: Charlett NoseKevin  Dover M.D.   On: 05/27/2016 14:10   Ct Renal Stone Study  Result Date: 05/27/2016 CLINICAL DATA:  History of left kidney stone. Now with right flank pain. EXAM: CT ABDOMEN AND PELVIS WITHOUT CONTRAST TECHNIQUE: Multidetector CT imaging of the abdomen and pelvis was performed following the standard protocol without IV contrast. COMPARISON:  CT abdomen pelvis 05/23/2016 FINDINGS: Lower chest: Lung bases clear. Hepatobiliary: The liver gallbladder and bile ducts normal. Pancreas: Negative Spleen: Negative Adrenals/Urinary Tract: 4 mm calculus in the proximal left ureter is unchanged and causing mild hydronephrosis. There is now a second smaller stone in the proximal left ureter just above the larger stone. Previously only 1 stone was present in the ureter.  Multiple small bilateral renal calculi. No obstruction of the right kidney. No bladder calculi. 4.0 cm left adrenal mass unchanged from prior studies and most consistent with adenoma. Stomach/Bowel: Stomach and duodenum normal. Negative for bowel obstruction. Sigmoid diverticulosis without diverticulitis. Normal appendix. Vascular/Lymphatic: Negative Reproductive: Negative Other: No free fluid. Musculoskeletal: Negative IMPRESSION: 4 mm calculus proximal left ureter unchanged from 05/24/2016. There is now a second 3 mm stone in the proximal left ureter above the  4 mm stone. Mild left hydronephrosis Bilateral renal calculi bilaterally. No obstruction of the right kidney. Left adrenal adenoma unchanged. Sigmoid diverticulosis. Electronically Signed   By: Marlan Palau M.D.   On: 05/27/2016 15:55    Procedures Procedures  COORDINATION OF CARE: 2:06 PM Discussed next steps with pt. Pt verbalized understanding and is agreeable with the plan.    Medications Ordered in ED Medications  ondansetron (ZOFRAN) injection 4 mg (4 mg Intravenous Given 05/27/16 1405)  HYDROmorphone (DILAUDID) injection 1 mg (1 mg Intravenous Given 05/27/16 1617)     Initial Impression / Assessment and Plan / ED Course  I have reviewed the triage vital signs and the nursing notes.  Pertinent labs & imaging results that were available during my care of the patient were reviewed by me and considered in my medical decision making (see chart for details).  Clinical Course    Workup for the shortness of breath without any acute findings. Oxygen saturation even ambulation remains above 90%. Chest x-ray negative. Reevaluation of the flank pain negatives no expiration to the right flank pain. Left flank pain certainly could be due to the 2 proximal ureteral stones. Close follow-up with urology needed. Patient states that he can't get seen by his urologist at [facility] until December 29. So gave him the option with a referral to our local  urologists. Patient also running low on his Percocets a Percocet renewed. Patient improved here with pain medicine control. Patient nontoxic no acute distress. Patient is showing evidence of creatinine elevation and will require close urology follow-up.  I personally performed the services described in this documentation, which was scribed in my presence. The recorded information has been reviewed and is accurate.    Final Clinical Impressions(s) / ED Diagnoses   Final diagnoses:  SOB (shortness of breath)  Left ureteral stone    New Prescriptions New Prescriptions   OXYCODONE-ACETAMINOPHEN (PERCOCET/ROXICET) 5-325 MG TABLET    Take 1-2 tablets by mouth every 6 (six) hours as needed for severe pain.     Vanetta Mulders, MD 05/27/16 1758

## 2016-05-27 NOTE — ED Notes (Signed)
Pt O2 saturation 94%-98% while ambulating around nurse's station x 2 on room air.

## 2016-06-02 DIAGNOSIS — N179 Acute kidney failure, unspecified: Secondary | ICD-10-CM | POA: Insufficient documentation

## 2016-06-02 DIAGNOSIS — I5033 Acute on chronic diastolic (congestive) heart failure: Secondary | ICD-10-CM | POA: Insufficient documentation

## 2016-08-18 DIAGNOSIS — M1A09X0 Idiopathic chronic gout, multiple sites, without tophus (tophi): Secondary | ICD-10-CM | POA: Insufficient documentation

## 2016-08-18 DIAGNOSIS — N184 Chronic kidney disease, stage 4 (severe): Secondary | ICD-10-CM | POA: Insufficient documentation

## 2016-08-18 DIAGNOSIS — N200 Calculus of kidney: Secondary | ICD-10-CM | POA: Insufficient documentation

## 2016-10-23 DIAGNOSIS — M109 Gout, unspecified: Secondary | ICD-10-CM | POA: Insufficient documentation

## 2016-11-24 IMAGING — CT CT ANGIO CHEST
1 of 6 series · 5 of 36 positions shown · IV contrast (Omnipaque 300)
Comparison: None.

CLINICAL DATA: Chest pain and leg swelling.  History of DVT

EXAM:
CT ANGIOGRAPHY CHEST WITH CONTRAST
TECHNIQUE: Multidetector CT imaging of the chest was performed using the
standard protocol during bolus administration of intravenous
contrast. Multiplanar CT image reconstructions and MIPs were
obtained to evaluate the vascular anatomy.
CONTRAST:  100mL OMNIPAQUE IOHEXOL 350 MG/ML SOLN

[Series 6: pe 3.0 b40f · axial · 0.90mm/px · z∈[-308,-128]mm · 5 of 90 slices shown]
[im 15/90  lung]
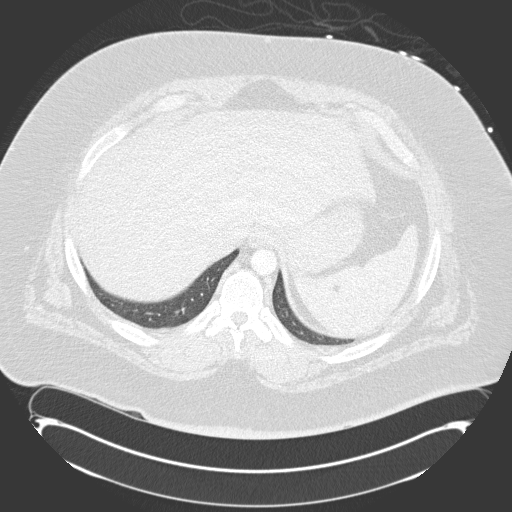
[im 30/90  mediastinal]
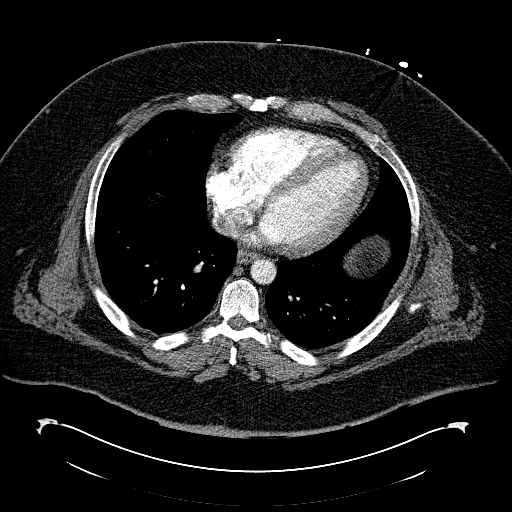
[im 45/90  lung]
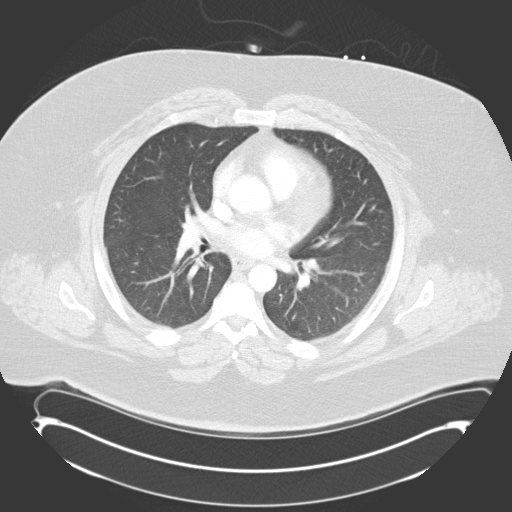
[im 60/90  mediastinal]
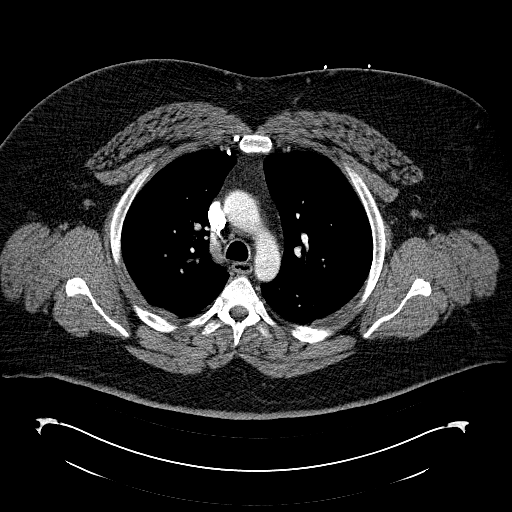
[im 75/90  lung]
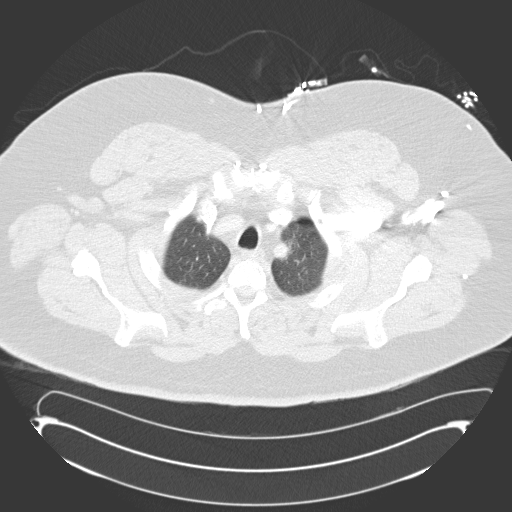

[5 of 36 positions shown; findings below may reference images not displayed]

FINDINGS: THORACIC INLET/BODY WALL:

No acute abnormality.

MEDIASTINUM:

Normal heart size. No pericardial effusion. CTA of the pulmonary
arteries is limited by patient size, bolus dispersion, and
intermittent motion, but study is overall diagnostic and negative
for pulmonary embolism. No adenopathy.

LUNG WINDOWS:

Diffuse bronchial wall thickening without collapse or consolidation.
No edema, effusion, or air leak

UPPER ABDOMEN:

39 mm left adrenal mass, adenoma based on previous MRI. The nodule
is partially visualized today.

OSSEOUS:

No acute fracture.  No suspicious lytic or blastic lesions.

Review of the MIP images confirms the above findings.
IMPRESSION: 1. No evidence of pulmonary embolism.
2. Bronchitis.

## 2016-12-21 IMAGING — US US EXTREM LOW VENOUS*L*
1 series · 13 of 24 positions shown · non-contrast
Comparison: 01/26/2012

CLINICAL DATA: Chest pain and leg swelling.  History of DVT



[Series 2: us extrem low venous*left* · 0.11mm/px · 13 of 40 slices shown]
[im 1/40]
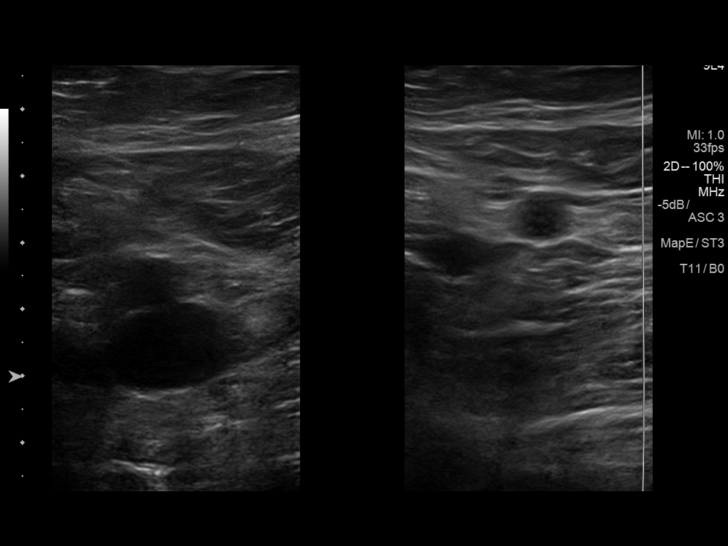
[im 4/40]
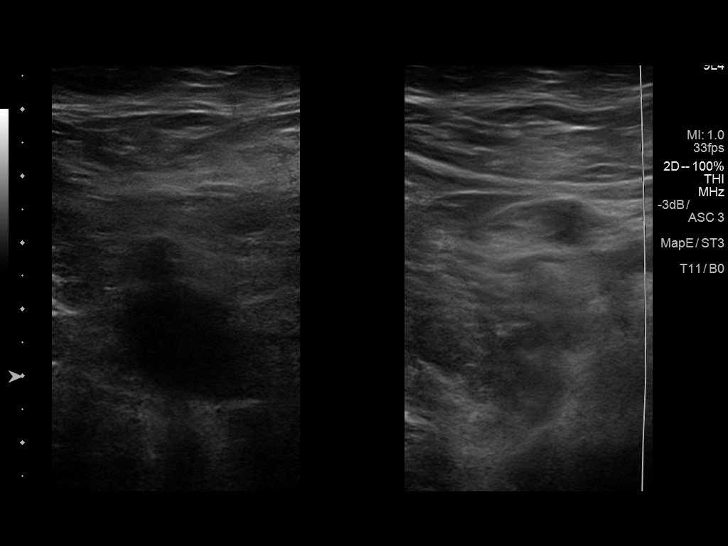
[im 7/40]
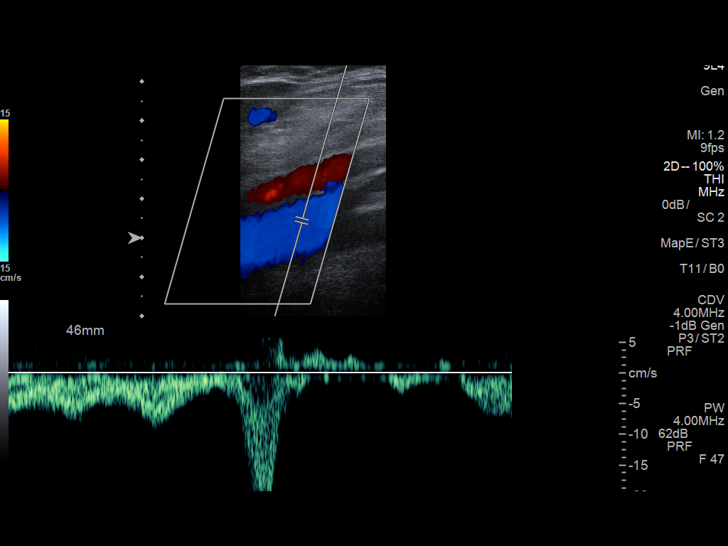
[im 11/40]
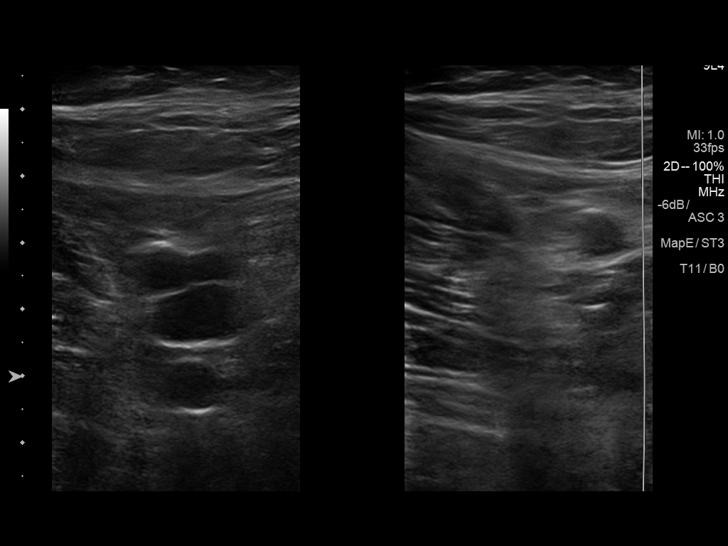
[im 14/40]
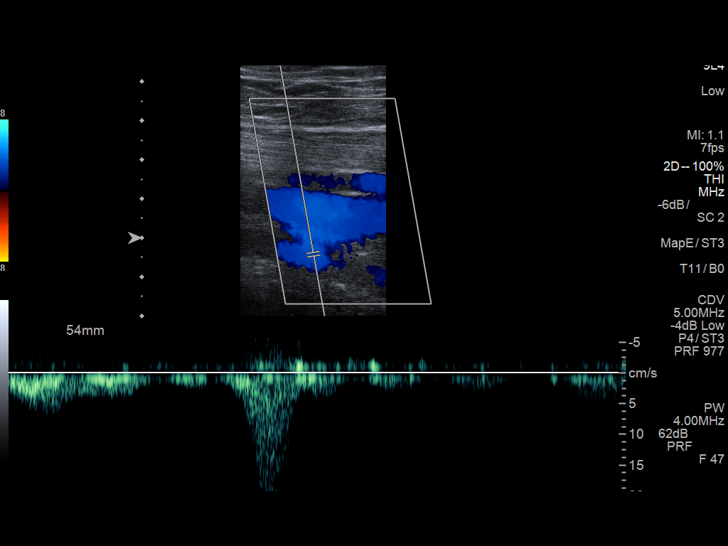
[im 17/40]
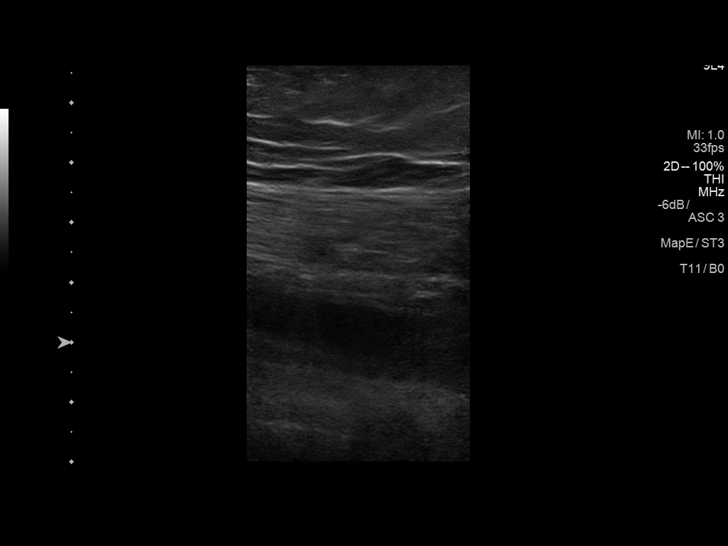
[im 21/40]
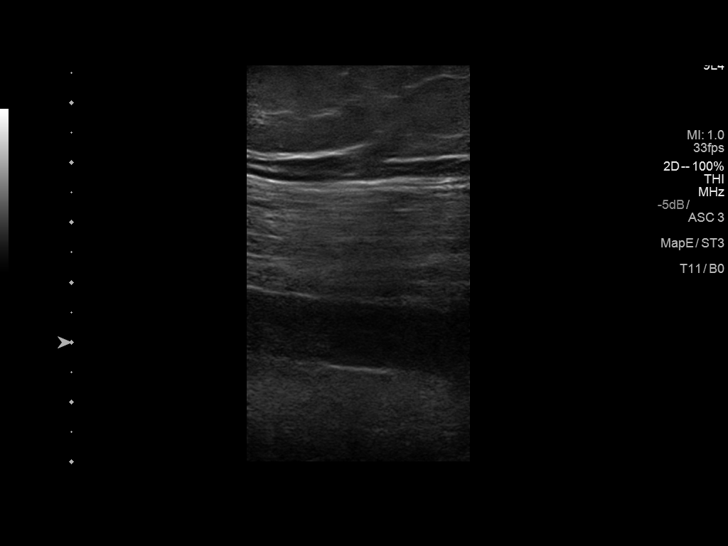
[im 23/40]
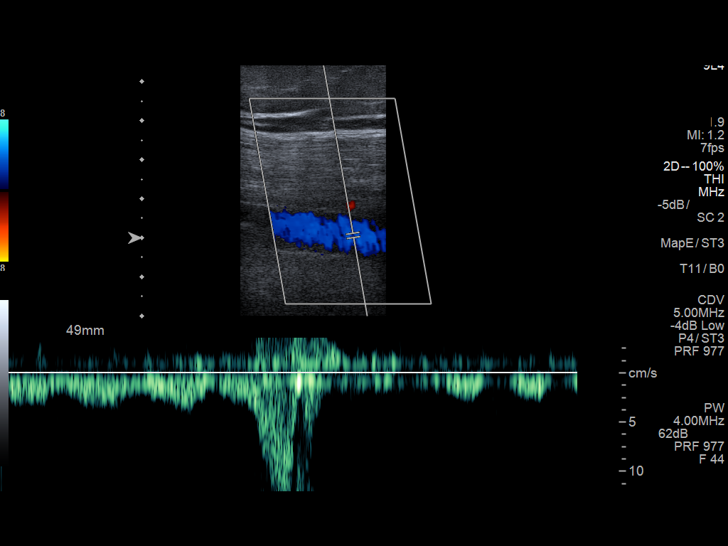
[im 26/40]
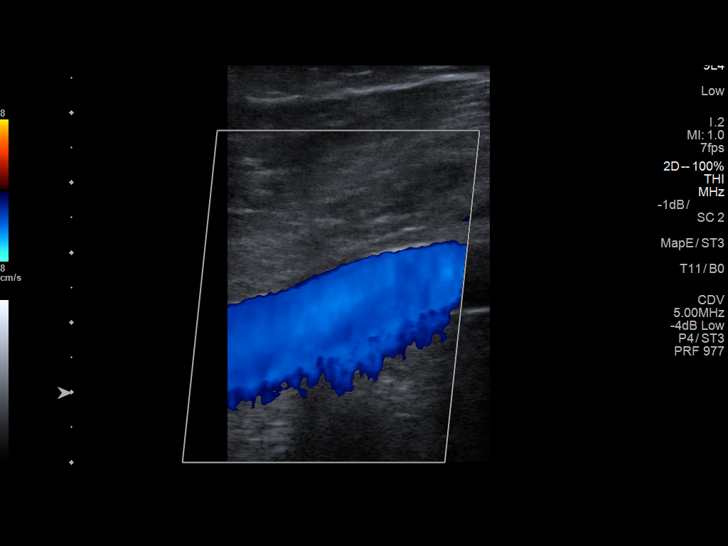
[im 29/40]
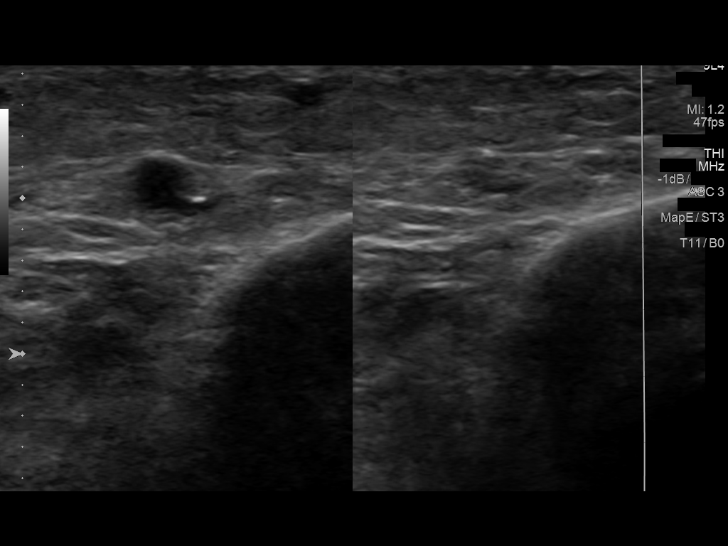
[im 33/40]
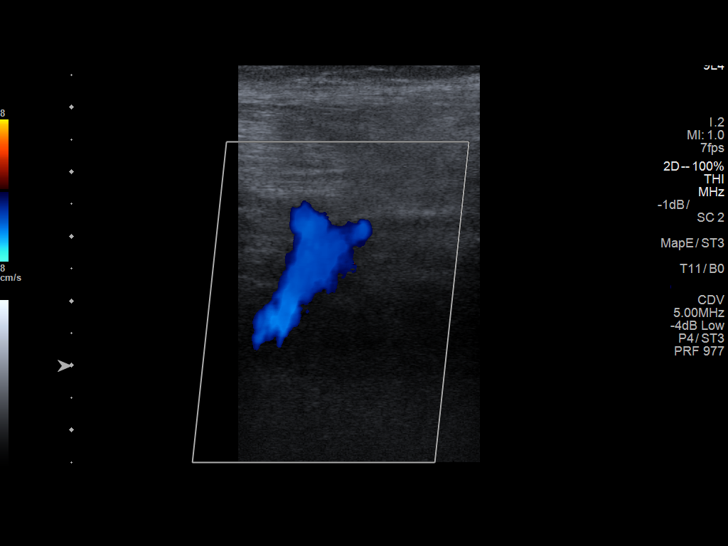
[im 36/40]
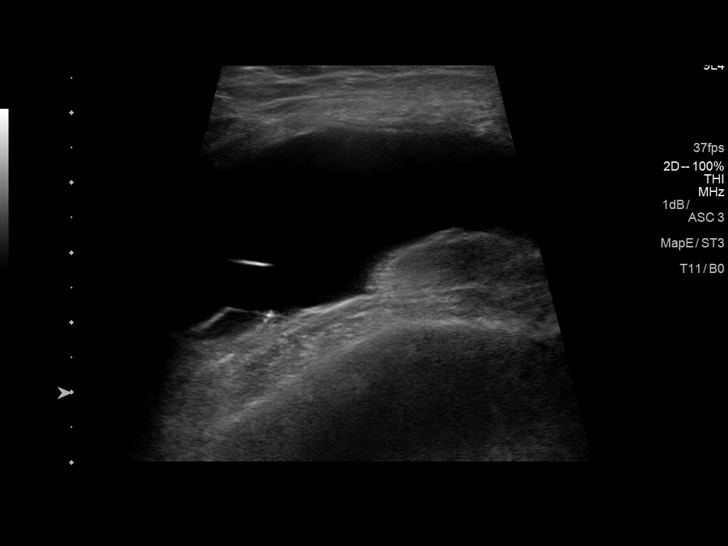
[im 40/40]
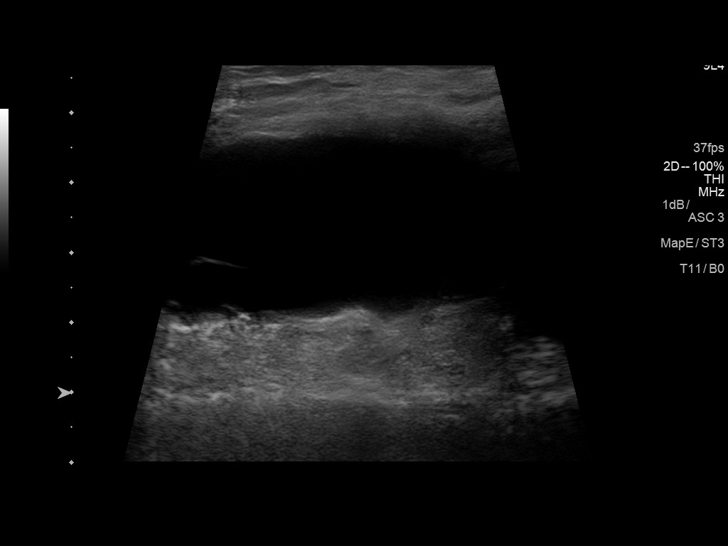

[13 of 24 positions shown; findings below may reference images not displayed]

FINDINGS: Contralateral Common Femoral Vein: Respiratory phasicity is normal
and symmetric with the symptomatic side. No evidence of thrombus.
Normal compressibility.

Common Femoral Vein: No evidence of thrombus.

Saphenofemoral Junction: No evidence of thrombus.

Profunda Femoral Vein: No evidence of thrombus.

Femoral Vein: No evidence of thrombus.

Popliteal Vein: No evidence of thrombus.

Calf Veins: No evidence of thrombus.

Superficial Great Saphenous Vein: No evidence of thrombus.

Other Findings: There is a large partly visualized fluid collection
with predominantly simple anechoic contents measuring 8 x 3 x 7 cm.
The collection appears deep and is favored to reflect knee joint
effusion. The collection is not in the popliteal fossa to suggest
Baker cyst.
IMPRESSION: 1. No evidence of left lower extremity DVT.
2. Large fluid collection at the knee, likely joint effusion.

## 2018-07-11 ENCOUNTER — Encounter (HOSPITAL_COMMUNITY): Payer: Self-pay | Admitting: Emergency Medicine

## 2018-07-11 ENCOUNTER — Other Ambulatory Visit: Payer: Self-pay

## 2018-07-11 ENCOUNTER — Emergency Department (HOSPITAL_COMMUNITY)
Admission: EM | Admit: 2018-07-11 | Discharge: 2018-07-11 | Disposition: A | Discharge status: Home / Self Care | Payer: Medicaid Other | Attending: Emergency Medicine | Admitting: Emergency Medicine

## 2018-07-11 DIAGNOSIS — I509 Heart failure, unspecified: Secondary | ICD-10-CM | POA: Insufficient documentation

## 2018-07-11 DIAGNOSIS — J449 Chronic obstructive pulmonary disease, unspecified: Secondary | ICD-10-CM | POA: Insufficient documentation

## 2018-07-11 DIAGNOSIS — K625 Hemorrhage of anus and rectum: Secondary | ICD-10-CM | POA: Diagnosis not present

## 2018-07-11 DIAGNOSIS — Z79899 Other long term (current) drug therapy: Secondary | ICD-10-CM | POA: Insufficient documentation

## 2018-07-11 DIAGNOSIS — F17210 Nicotine dependence, cigarettes, uncomplicated: Secondary | ICD-10-CM | POA: Diagnosis not present

## 2018-07-11 LAB — COMPREHENSIVE METABOLIC PANEL
ALBUMIN: 3.7 g/dL (ref 3.5–5.0)
ALK PHOS: 123 U/L (ref 38–126)
ALT: 31 U/L (ref 0–44)
AST: 24 U/L (ref 15–41)
Anion gap: 7 (ref 5–15)
BILIRUBIN TOTAL: 0.6 mg/dL (ref 0.3–1.2)
BUN: 12 mg/dL (ref 6–20)
CO2: 29 mmol/L (ref 22–32)
CREATININE: 1.13 mg/dL (ref 0.61–1.24)
Calcium: 9.1 mg/dL (ref 8.9–10.3)
Chloride: 106 mmol/L (ref 98–111)
GFR calc Af Amer: >60 mL/min (ref >60)
GLUCOSE: 114 mg/dL — AB (ref 70–99)
Potassium: 4 mmol/L (ref 3.5–5.1)
Sodium: 142 mmol/L (ref 135–145)
TOTAL PROTEIN: 6.7 g/dL (ref 6.5–8.1)

## 2018-07-11 LAB — CBC
HCT: 50.3 % (ref 39.0–52.0)
Hemoglobin: 16.2 g/dL (ref 13.0–17.0)
MCH: 29.6 pg (ref 26.0–34.0)
MCHC: 32.2 g/dL (ref 30.0–36.0)
MCV: 92 fL (ref 80.0–100.0)
Platelets: 243 10*3/uL (ref 150–400)
RBC: 5.47 MIL/uL (ref 4.22–5.81)
RDW: 13.1 % (ref 11.5–15.5)
WBC: 10.5 10*3/uL (ref 4.0–10.5)
nRBC: 0 % (ref 0.0–0.2)

## 2018-07-11 LAB — POC OCCULT BLOOD, ED: Fecal Occult Bld: POSITIVE — AB

## 2018-07-11 LAB — TYPE AND SCREEN
ABO/RH(D): A POS
Antibody Screen: NEGATIVE

## 2018-07-11 NOTE — ED Provider Notes (Signed)
[facility] EMERGENCY DEPARTMENT Provider Note   CSN: [id number] Arrival date & time: 07/11/18  1549     History   Chief Complaint Chief Complaint  Patient presents with  . Rectal Bleeding    HPI Darrell Lindsey is a 48 y.o. male.  HPI  Patient is a 48 year old male, known history of atrial flutter, atrial fibrillation status post ablation and not currently taking anticoagulants other than occasional aspirin.  He has a known history of diastolic congestive heart failure and hypertension and is treated with medications for the same.  He is also known to have COPD and gout.  He takes colchicine and allopurinol daily.  He presents with a chief complaint of rectal bleeding which occurred 5 hours ago when he was having a bowel movement.  When he went down to wipe he saw lots of blood on the tissue, seemingly brown stool in the commode, no abdominal pain with this, no lightheadedness or dizziness or shortness of breath.  He has never had anything like this in the last 15 years though he does report having a colonoscopy after having some bright red blood 15 years ago and was told that he had strained too hard and had some broken capillaries which were bleeding.  He does report that he strains with every bowel movement, he pushes very hard, his bowel movements have not changed, there formed to hard and he has a daily bowel movement.  He does recall 1 episode of nausea and vomiting yesterday but is not currently nauseated or vomiting.  He denies any rectal foreign bodies  Past Medical History:  Diagnosis Date  . Anxiety   . Asthma   . Atrial flutter (HCC) 2012   Tx with ablation  . CHF (congestive heart failure) (HCC)   . Chronic back pain   . COPD (chronic obstructive pulmonary disease) (HCC)   . DOE (dyspnea on exertion)    chronic  . Gout   . Lumbar radiculopathy   . OSA on CPAP   . Renal disorder    kidney stones  . Superficial thrombosis of leg     There are no active  problems to display for this patient.   Past Surgical History:  Procedure Laterality Date  . ADENOIDECTOMY    . cardaic ablasion    . removal of salivary gland           Home Medications    Prior to Admission medications   Medication Sig Start Date End Date Taking? Authorizing Provider  albuterol (PROAIR HFA) 108 (90 BASE) MCG/ACT inhaler Inhale 1-2 puffs into the lungs every 6 (six) hours as needed for wheezing or shortness of breath.    [provider]  albuterol (PROVENTIL) (2.5 MG/3ML) 0.083% nebulizer solution Take 2.5 mg by nebulization every 6 (six) hours as needed for wheezing or shortness of breath.    [provider]  allopurinol (ZYLOPRIM) 300 MG tablet Take 300 mg by mouth at bedtime.     [provider]  aspirin EC 325 MG tablet Take 325 mg by mouth daily.    [provider]  atorvastatin (LIPITOR) 20 MG tablet Take 20 mg by mouth daily.    [provider]  colchicine 0.6 MG tablet Take 0.6 mg by mouth daily.    [provider]  fluticasone (FLONASE) 50 MCG/ACT nasal spray Place 1 spray into both nostrils daily as needed for allergies or rhinitis.    [provider]  Fluticasone-Salmeterol (ADVAIR) 250-50 MCG/DOSE  AEPB Inhale 1 puff into the lungs 2 (two) times daily.    [provider]  furosemide (LASIX) 20 MG tablet Take 20 mg by mouth.    [provider]  gabapentin (NEURONTIN) 400 MG capsule Take 800 mg by mouth 3 (three) times daily.    [provider]  HYDROcodone-acetaminophen (NORCO) 10-325 MG tablet Take 2 tablets by mouth every 6 (six) hours as needed for moderate pain.    [provider]  ibuprofen (ADVIL,MOTRIN) 200 MG tablet Take 200 mg by mouth every 6 (six) hours as needed for mild pain or moderate pain.     [provider]  lisinopril (PRINIVIL,ZESTRIL) 10 MG tablet Take 10 mg by mouth every evening.     [provider]  metoprolol succinate  (TOPROL-XL) 50 MG 24 hr tablet Take 75 mg by mouth daily. Take with or immediately following a meal.    [provider]  ondansetron (ZOFRAN) 4 MG tablet Take 2 tablets (8 mg total) by mouth every 8 (eight) hours as needed for nausea or vomiting. 02/15/16   Lavera Guise, MD  oxyCODONE-acetaminophen (PERCOCET/ROXICET) 5-325 MG tablet Take 1-2 tablets by mouth every 6 (six) hours as needed for severe pain. 05/27/16   Vanetta Mulders, MD  oxyCODONE-acetaminophen (ROXICET) 5-325 MG tablet Take 1-2 tablets by mouth every 6 (six) hours as needed for severe pain. 02/15/16   Lavera Guise, MD  predniSONE (DELTASONE) 50 MG tablet Take 1 tablet (50 mg total) by mouth once. Patient not taking: Reported on 02/15/2016 11/11/15   Linwood Dibbles, MD  tamsulosin (FLOMAX) 0.4 MG CAPS capsule Take 0.4 mg by mouth daily after supper.    [provider]  tiotropium (SPIRIVA) 18 MCG inhalation capsule Place 1 capsule (18 mcg total) into inhaler and inhale daily. 11/11/15   Linwood Dibbles, MD    Family History History reviewed. No pertinent family history.  Social History Social History   Tobacco Use  . Smoking status: Current Every Day Smoker    Packs/day: 0.50    Types: Cigarettes  . Smokeless tobacco: Never Used  Substance Use Topics  . Alcohol use: Not Currently    Comment: rarely  . Drug use: Yes    Types: Marijuana     Allergies   Effexor [venlafaxine hcl]   Review of Systems Review of Systems  All other systems reviewed and are negative.    Physical Exam Updated Vital Signs BP (!) 145/81 (BP Location: Left Arm)   Pulse 72   Temp 98.5 F (36.9 C) (Oral)   Resp 20   Ht 1.829 m (6')   Wt (!) 167.8 kg   SpO2 96%   BMI 50.18 kg/m   Physical Exam Vitals signs and nursing note reviewed.  Constitutional:      General: He is not in acute distress.    Appearance: He is well-developed. He is obese.  HENT:     Head: Normocephalic and atraumatic.     Mouth/Throat:     Pharynx:  No oropharyngeal exudate.  Eyes:     General: No scleral icterus.       Right eye: No discharge.        Left eye: No discharge.     Conjunctiva/sclera: Conjunctivae normal.     Pupils: Pupils are equal, round, and reactive to light.  Neck:     Musculoskeletal: Normal range of motion and neck supple.     Thyroid: No thyromegaly.     Vascular: No JVD.  Cardiovascular:     Rate and Rhythm: Normal rate and regular rhythm.     Heart sounds: Normal heart sounds. No murmur. No friction rub. No gallop.   Pulmonary:     Effort: Pulmonary effort is normal. No respiratory distress.     Breath sounds: Normal breath sounds. No wheezing or rales.  Abdominal:     General: Bowel sounds are normal. There is no distension.     Palpations: Abdomen is soft. There is no mass.     Tenderness: There is no abdominal tenderness.     Comments: Soft and totally nontender abdomen  Genitourinary:    Comments: See anoscopy note below Musculoskeletal: Normal range of motion.        General: No swelling or tenderness.     Right lower leg: No edema.     Left lower leg: No edema.  Lymphadenopathy:     Cervical: No cervical adenopathy.  Skin:    General: Skin is warm and dry.     Findings: No erythema or rash.  Neurological:     Mental Status: He is alert.     Coordination: Coordination normal.  Psychiatric:        Behavior: Behavior normal.      ED Treatments / Results  Labs (all labs ordered are listed, but only abnormal results are displayed) Labs Reviewed  COMPREHENSIVE METABOLIC PANEL - Abnormal; Notable for the following components:      Result Value   Glucose, Bld 114 (*)    All other components within normal limits  POC OCCULT BLOOD, ED - Abnormal; Notable for the following components:   Fecal Occult Bld POSITIVE (*)    All other components within normal limits  CBC  TYPE AND SCREEN    EKG None  Radiology No results found.  Procedures LOWER ENDOSCOPY Date/Time: 07/11/2018 5:59  PM Performed by: Eber HongMiller, Darrell Carmean, MD Authorized by: Eber HongMiller, Darrell Croll, MD  Consent: Verbal consent obtained. Risks and benefits: risks, benefits and alternatives were discussed Consent given by: patient Patient understanding: patient states understanding of the procedure being performed Patient consent: the patient's understanding of the procedure matches consent given Procedure consent: procedure consent matches procedure scheduled Relevant documents: relevant documents present and verified Test results: test results available and properly labeled Site marked: the operative site was marked Imaging studies: imaging studies available Required items: required blood products, implants, devices, and special equipment available Patient identity confirmed: verbally with patient Time out: Immediately prior to procedure a "time out" was called to verify the correct patient, procedure, equipment, support staff and site/side marked as required. Indications: rectal bleeding  Sedation: Patient sedated: no  Scope type: anoscope Positive internal exam findings: internal hemorrhoid Negative internal exam findings: no anal fissures, no anal stricture and no abscess Internal hemorrhoid prolapsed: no Patient tolerance: Patient tolerated the procedure well with no immediate complications Comments: Chaperone present for exam - patient tolerated well and was informed of the results. There is some heme positive stool but no bright red blood, no actively bleeding hemorrhoids, no fissures.     (including critical care time)  Medications Ordered in ED Medications - No data to display   Initial Impression / Assessment and Plan / ED Course  I have reviewed the triage vital signs and the nursing notes.  Pertinent labs & imaging results that were available during my care of the patient were reviewed by me and considered in my medical decision making (see chart for details).  Clinical Course as of Jul 11 1814   Tue Jul 11, 2018  1813 Gust care with Dr. Dionicia Abler of the GI service at 6:15 PM, he is happy to see the patient in the clinic.  I have discussed with the patient at length the indications for return including heavier bleeding or increasing pain, lightheadedness shortness of breath or dyspnea on exertion and he is agreeable to return should those things happen.  I have recommended stool softeners and to avoid straining, he is agreeable.   [BM]    Clinical Course User Index [BM] Eber Hong, MD    The patient's exam is less  concerning for life-threatening bleeding or other severe intra-abdominal pathology He has no abdominal tenderness, vital signs do not reflect hypotension and his blood counts show a hemoglobin of 16.2 suggesting no significant anemia.  He will definitely need follow-up with gastroenterology however this time I do not think he needs admission to the hospital or any other acute work-up.  I will have him temporarily hold his antiplatelet agent aspirin  Final Clinical Impressions(s) / ED Diagnoses   Final diagnoses:  Rectal bleeding    ED Discharge Orders    None       Eber Hong, MD 07/11/18 1816

## 2018-07-11 NOTE — Discharge Instructions (Signed)
Your testing today did not show any signs of active bleeding nor did it show any signs of low blood counts however if you should develop severe or worsening symptoms including increasing pain, vomiting, increasing bleeding then you should return to the emergency department immediately.  Please do not strain with bowel movements  Please take MiraLAX twice a day to keep your stools loose and runny.  Please follow-up with a gastroenterologist which I have listed above.  He is willing to see you in the office to get your work-up going.

## 2018-07-11 NOTE — ED Triage Notes (Signed)
Patient complaining of rectal bleeding that started at noon today. Denies pain.

## 2018-07-31 ENCOUNTER — Encounter: Payer: Self-pay | Admitting: Internal Medicine

## 2018-09-06 ENCOUNTER — Encounter (HOSPITAL_COMMUNITY): Payer: Self-pay | Admitting: Emergency Medicine

## 2018-09-06 ENCOUNTER — Other Ambulatory Visit: Payer: Self-pay

## 2018-09-06 ENCOUNTER — Emergency Department (HOSPITAL_COMMUNITY)
Admission: EM | Admit: 2018-09-06 | Discharge: 2018-09-06 | Disposition: A | Discharge status: Home / Self Care | Payer: Medicaid Other | Attending: Emergency Medicine | Admitting: Emergency Medicine

## 2018-09-06 DIAGNOSIS — L0291 Cutaneous abscess, unspecified: Secondary | ICD-10-CM

## 2018-09-06 DIAGNOSIS — L02416 Cutaneous abscess of left lower limb: Secondary | ICD-10-CM | POA: Insufficient documentation

## 2018-09-06 DIAGNOSIS — J449 Chronic obstructive pulmonary disease, unspecified: Secondary | ICD-10-CM | POA: Insufficient documentation

## 2018-09-06 DIAGNOSIS — Z7982 Long term (current) use of aspirin: Secondary | ICD-10-CM | POA: Insufficient documentation

## 2018-09-06 DIAGNOSIS — I509 Heart failure, unspecified: Secondary | ICD-10-CM | POA: Insufficient documentation

## 2018-09-06 DIAGNOSIS — F17210 Nicotine dependence, cigarettes, uncomplicated: Secondary | ICD-10-CM | POA: Insufficient documentation

## 2018-09-06 DIAGNOSIS — R2242 Localized swelling, mass and lump, left lower limb: Secondary | ICD-10-CM | POA: Diagnosis present

## 2018-09-06 DIAGNOSIS — Z79899 Other long term (current) drug therapy: Secondary | ICD-10-CM | POA: Insufficient documentation

## 2018-09-06 MED ORDER — PENTAFLUOROPROP-TETRAFLUOROETH EX AERO
INHALATION_SPRAY | CUTANEOUS | Status: DC | PRN
  Administered 2018-09-06: 19:00:00 via TOPICAL
  Filled 2018-09-06: qty 116

## 2018-09-06 MED ORDER — SULFAMETHOXAZOLE-TRIMETHOPRIM 800-160 MG PO TABS: 1.0000 | ORAL_TABLET | Freq: Two times a day (BID) | ORAL | 0 refills | Status: AC

## 2018-09-06 MED ORDER — SULFAMETHOXAZOLE-TRIMETHOPRIM 800-160 MG PO TABS: 1.0000 | ORAL_TABLET | Freq: Once | ORAL | Status: DC

## 2018-09-06 MED ORDER — POVIDONE-IODINE 10 % EX SOLN
CUTANEOUS | Status: AC
  Administered 2018-09-06: 19:00:00
  Filled 2018-09-06: qty 15

## 2018-09-06 MED ORDER — LIDOCAINE HCL (PF) 1 % IJ SOLN
10.0000 mL | Freq: Once | INTRAMUSCULAR | Status: AC
  Administered 2018-09-06: 10 mL
  Filled 2018-09-06: qty 10

## 2018-09-06 NOTE — Discharge Instructions (Addendum)
Complete your entire course of the antibiotics taking your next dose tomorrow morning.  Apply a warm water soak (tub soak with epsom salt water or direct spray from shower as discussed - 3 times daily.  Get rechecked if this is not improving over the next week.  It will be about 3 days before your culture has resulted.

## 2018-09-06 NOTE — ED Provider Notes (Signed)
[facility] EMERGENCY DEPARTMENT Provider Note   CSN: [id number] Arrival date & time: 09/06/18  1727    History   Chief Complaint Chief Complaint  Patient presents with  . Abscess    HPI Darrell Lindsey is a 48 y.o. male.     The history is provided by the patient.  Abscess  Location:  Leg Leg abscess location:  L upper leg Abscess quality: fluctuance, induration, painful and redness   Red streaking: no   Duration:  1 week Progression:  Worsening Pain details:    Quality:  Throbbing, sharp and tightness Chronicity:  Recurrent Context: not diabetes, not immunosuppression and not injected drug use   Relieved by:  Nothing Exacerbated by: pressure. Ineffective treatments:  Warm water soaks Associated symptoms: no fever, no nausea and no vomiting   Risk factors: prior abscess   Risk factors: no hx of MRSA   Risk factors comment:  No hx of mrsa but recently found a close acquaintence is a "carrier".  he has seen a dermatologist recently due to frequent abscesses and is currently on a 30 day daily bleach and water soak regimen at the derms rec.    Past Medical History:  Diagnosis Date  . Anxiety   . Asthma   . Atrial flutter (HCC) 2012   Tx with ablation  . CHF (congestive heart failure) (HCC)   . Chronic back pain   . COPD (chronic obstructive pulmonary disease) (HCC)   . DOE (dyspnea on exertion)    chronic  . Gout   . Lumbar radiculopathy   . OSA on CPAP   . Renal disorder    kidney stones  . Superficial thrombosis of leg     There are no active problems to display for this patient.   Past Surgical History:  Procedure Laterality Date  . ADENOIDECTOMY    . cardaic ablasion    . LITHOTRIPSY    . removal of salivary gland           Home Medications    Prior to Admission medications   Medication Sig Start Date End Date Taking? Authorizing Provider  albuterol (PROAIR HFA) 108 (90 BASE) MCG/ACT inhaler Inhale 1-2 puffs into the lungs every 6 (six)  hours as needed for wheezing or shortness of breath.    [provider]  albuterol (PROVENTIL) (2.5 MG/3ML) 0.083% nebulizer solution Take 2.5 mg by nebulization every 6 (six) hours as needed for wheezing or shortness of breath.    [provider]  allopurinol (ZYLOPRIM) 300 MG tablet Take 300 mg by mouth at bedtime.     [provider]  aspirin EC 325 MG tablet Take 325 mg by mouth daily.    [provider]  atorvastatin (LIPITOR) 20 MG tablet Take 20 mg by mouth daily.    [provider]  colchicine 0.6 MG tablet Take 0.6 mg by mouth daily.    [provider]  fluticasone (FLONASE) 50 MCG/ACT nasal spray Place 1 spray into both nostrils daily as needed for allergies or rhinitis.    [provider]  Fluticasone-Salmeterol (ADVAIR) 250-50 MCG/DOSE AEPB Inhale 1 puff into the lungs 2 (two) times daily.    [provider]  furosemide (LASIX) 20 MG tablet Take 20 mg by mouth.    [provider]  gabapentin (NEURONTIN) 400 MG capsule Take 800 mg by mouth 3 (three) times daily.    [provider]  HYDROcodone-acetaminophen (NORCO) 10-325 MG tablet Take 2 tablets by  mouth every 6 (six) hours as needed for moderate pain.    [provider]  ibuprofen (ADVIL,MOTRIN) 200 MG tablet Take 200 mg by mouth every 6 (six) hours as needed for mild pain or moderate pain.     [provider]  lisinopril (PRINIVIL,ZESTRIL) 10 MG tablet Take 10 mg by mouth every evening.     [provider]  metoprolol succinate (TOPROL-XL) 50 MG 24 hr tablet Take 75 mg by mouth daily. Take with or immediately following a meal.    [provider]  ondansetron (ZOFRAN) 4 MG tablet Take 2 tablets (8 mg total) by mouth every 8 (eight) hours as needed for nausea or vomiting. 02/15/16   Lavera Guise, MD  oxyCODONE-acetaminophen (PERCOCET/ROXICET) 5-325 MG tablet Take 1-2 tablets by mouth every 6 (six) hours as needed  for severe pain. 05/27/16   Vanetta Mulders, MD  oxyCODONE-acetaminophen (ROXICET) 5-325 MG tablet Take 1-2 tablets by mouth every 6 (six) hours as needed for severe pain. 02/15/16   Lavera Guise, MD  predniSONE (DELTASONE) 50 MG tablet Take 1 tablet (50 mg total) by mouth once. Patient not taking: Reported on 02/15/2016 11/11/15   Linwood Dibbles, MD  sulfamethoxazole-trimethoprim (BACTRIM DS,SEPTRA DS) 800-160 MG tablet Take 1 tablet by mouth 2 (two) times daily for 10 days. 09/06/18 09/16/18  Burgess Amor, PA-C  tamsulosin (FLOMAX) 0.4 MG CAPS capsule Take 0.4 mg by mouth daily after supper.    [provider]  tiotropium (SPIRIVA) 18 MCG inhalation capsule Place 1 capsule (18 mcg total) into inhaler and inhale daily. 11/11/15   Linwood Dibbles, MD    Family History No family history on file.  Social History Social History   Tobacco Use  . Smoking status: Current Every Day Smoker    Packs/day: 0.50    Types: Cigarettes  . Smokeless tobacco: Never Used  Substance Use Topics  . Alcohol use: Not Currently    Comment: rarely  . Drug use: Yes    Types: Marijuana     Allergies   Effexor [venlafaxine hcl]   Review of Systems Review of Systems  Constitutional: Negative for chills and fever.  Respiratory: Negative for shortness of breath and wheezing.   Gastrointestinal: Negative for nausea and vomiting.  Skin: Positive for color change.       Negative except as mentioned in HPI.   Neurological: Negative for numbness.     Physical Exam Updated Vital Signs BP (!) 142/90   Pulse 70   Temp 98.1 F (36.7 C) (Oral)   Resp 16   Ht 6' (1.829 m)   Wt (!) 167.8 kg   SpO2 95%   BMI 50.18 kg/m   Physical Exam Constitutional:      General: He is not in acute distress.    Appearance: He is well-developed.  HENT:     Head: Normocephalic.  Neck:     Musculoskeletal: Neck supple.  Cardiovascular:     Rate and Rhythm: Normal rate.  Pulmonary:     Effort: Pulmonary effort is  normal.     Breath sounds: No wheezing.  Musculoskeletal: Normal range of motion.  Skin:    Findings: No rash.     Comments: Induration and erythema left upper medial thigh measuring 8 cm.  Central raised fluctuant abscess without drainage.      ED Treatments / Results  Labs (all labs ordered are listed, but only abnormal results are displayed) Labs Reviewed  AEROBIC CULTURE (SUPERFICIAL SPECIMEN)  EKG None  Radiology No results found.  Procedures Procedures (including critical care time)  INCISION AND DRAINAGE Performed by: Burgess Amor Consent: Verbal consent obtained. Risks and benefits: risks, benefits and alternatives were discussed Type: abscess  Body area: left medial upper thigh  Anesthesia: local infiltration  Incision was made with a scalpel after freeze spray application  Local anesthetic: lidocaine 1% without epinephrine  Anesthetic total: 4 ml  Complexity: complex Blunt dissection to break up loculations  Drainage: purulent  Drainage amount: moderate  Packing material: none  Patient tolerance: Patient tolerated the procedure well with no immediate complications.     Medications Ordered in ED Medications  pentafluoroprop-tetrafluoroeth (GEBAUERS) aerosol ( Topical Given 09/06/18 1843)  sulfamethoxazole-trimethoprim (BACTRIM DS,SEPTRA DS) 800-160 MG per tablet 1 tablet (has no administration in time range)  lidocaine (PF) (XYLOCAINE) 1 % injection 10 mL (10 mLs Infiltration Given by Other 09/06/18 1843)  povidone-iodine (BETADINE) 10 % external solution (  Given by Other 09/06/18 1842)     Initial Impression / Assessment and Plan / ED Course  I have reviewed the triage vital signs and the nursing notes.  Pertinent labs & imaging results that were available during my care of the patient were reviewed by me and considered in my medical decision making (see chart for details).        Discussed home tx including  Continued warm soaks,  bactrim started here.  Advised recheck by pcp for any new or worsening sx.    Final Clinical Impressions(s) / ED Diagnoses   Final diagnoses:  Abscess    ED Discharge Orders         Ordered    sulfamethoxazole-trimethoprim (BACTRIM DS,SEPTRA DS) 800-160 MG tablet  2 times daily     09/06/18 1851           Victoriano Lain 09/06/18 1913    Linwood Dibbles, MD 09/06/18 2213

## 2018-09-06 NOTE — ED Triage Notes (Addendum)
Onset 1 week, abscess left groin, not draining.

## 2018-09-10 LAB — AEROBIC CULTURE W GRAM STAIN (SUPERFICIAL SPECIMEN)
Culture: NORMAL
Special Requests: NORMAL

## 2018-09-10 LAB — AEROBIC CULTURE  (SUPERFICIAL SPECIMEN)

## 2018-09-11 ENCOUNTER — Telehealth: Payer: Self-pay | Admitting: Emergency Medicine

## 2018-09-11 NOTE — Telephone Encounter (Signed)
Post ED Visit - Positive Culture Follow-up  Culture report reviewed by antimicrobial stewardship pharmacist: [facility] []  Enzo Bi, Pharm.D. []  Celedonio Miyamoto, Pharm.D., BCPS AQ-ID []  Garvin Fila, Pharm.D., BCPS []  Georgina Pillion, Pharm.D., BCPS []  [place], [address].D., BCPS, AAHIVP []  Estella Husk, Pharm.D., BCPS, AAHIVP []  Lysle Pearl, PharmD, BCPS []  Phillips Climes, PharmD, BCPS []  Agapito Games, PharmD, BCPS []  Verlan Friends, PharmD [x]  Mervyn Gay, PharmD, BCPS []  Vinnie Level, PharmD  [facility] []  Len Childs, PharmD []  Greer Pickerel, PharmD []  Adalberto Cole, PharmD []  Perlie Gold, Rph []  Lonell Face) Jean Rosenthal, PharmD []  Earl Many, PharmD []  Junita Push, PharmD []  Dorna Leitz, PharmD []  Terrilee Files, PharmD []  Lynann Beaver, PharmD []  Keturah Barre, PharmD []  Loralee Pacas, PharmD []  Bernadene Person, PharmD   Positive wound culture Treated with sulfamethoxazole-trimethoprim, organism sensitive to the same and no further patient follow-up is required at this time.  Berle Mull 09/11/2018, 11:27 AM

## 2018-10-09 ENCOUNTER — Ambulatory Visit: Payer: Medicaid Other | Admitting: Nurse Practitioner

## 2018-10-14 ENCOUNTER — Other Ambulatory Visit: Payer: Self-pay

## 2018-10-14 ENCOUNTER — Emergency Department (HOSPITAL_COMMUNITY)
Admission: EM | Admit: 2018-10-14 | Discharge: 2018-10-14 | Disposition: A | Discharge status: Home / Self Care | Payer: Medicaid Other | Attending: Emergency Medicine | Admitting: Emergency Medicine

## 2018-10-14 ENCOUNTER — Encounter (HOSPITAL_COMMUNITY): Payer: Self-pay | Admitting: Emergency Medicine

## 2018-10-14 DIAGNOSIS — J45909 Unspecified asthma, uncomplicated: Secondary | ICD-10-CM | POA: Diagnosis not present

## 2018-10-14 DIAGNOSIS — K611 Rectal abscess: Secondary | ICD-10-CM | POA: Diagnosis present

## 2018-10-14 DIAGNOSIS — L02215 Cutaneous abscess of perineum: Secondary | ICD-10-CM | POA: Insufficient documentation

## 2018-10-14 DIAGNOSIS — Z7982 Long term (current) use of aspirin: Secondary | ICD-10-CM | POA: Diagnosis not present

## 2018-10-14 DIAGNOSIS — Z79899 Other long term (current) drug therapy: Secondary | ICD-10-CM | POA: Insufficient documentation

## 2018-10-14 MED ORDER — LIDOCAINE-EPINEPHRINE (PF) 2 %-1:200000 IJ SOLN
20.0000 mL | Freq: Once | INTRAMUSCULAR | Status: AC
  Administered 2018-10-14: 20 mL
  Filled 2018-10-14: qty 20

## 2018-10-14 MED ORDER — SULFAMETHOXAZOLE-TRIMETHOPRIM 800-160 MG PO TABS: 1.0000 | ORAL_TABLET | Freq: Two times a day (BID) | ORAL | 0 refills | Status: AC

## 2018-10-14 MED ORDER — SULFAMETHOXAZOLE-TRIMETHOPRIM 800-160 MG PO TABS
1.0000 | ORAL_TABLET | Freq: Once | ORAL | Status: AC
  Administered 2018-10-14: 1 via ORAL
  Filled 2018-10-14: qty 1

## 2018-10-14 NOTE — Discharge Instructions (Addendum)
Soak in hot salt water tub.  Good hygiene with Dial soap.  There will be leakage from the wound which is normal.  Tylenol for pain.  Prescription for antibiotic twice a day.  The wick needs to come out in 2 days.

## 2018-10-14 NOTE — ED Provider Notes (Signed)
[facility] EMERGENCY DEPARTMENT Provider Note   CSN: [id number] Arrival date & time: 10/14/18  1423    History   Chief Complaint Chief Complaint  Patient presents with  . Abscess    HPI Darrell Lindsey is a 48 y.o. male.     Abscess to left perineal region for 7 days.  No fever or chills.  Previous history of multiple abscesses.  Severity is moderate.  Palpation makes pain worse.     Past Medical History:  Diagnosis Date  . Anxiety   . Asthma   . Atrial flutter (HCC) 2012   Tx with ablation  . CHF (congestive heart failure) (HCC)   . Chronic back pain   . COPD (chronic obstructive pulmonary disease) (HCC)   . DOE (dyspnea on exertion)    chronic  . Gout   . Lumbar radiculopathy   . OSA on CPAP   . Renal disorder    kidney stones  . Superficial thrombosis of leg     There are no active problems to display for this patient.   Past Surgical History:  Procedure Laterality Date  . ADENOIDECTOMY    . cardaic ablasion    . LITHOTRIPSY    . removal of salivary gland           Home Medications    Prior to Admission medications   Medication Sig Start Date End Date Taking? Authorizing Provider  albuterol (PROAIR HFA) 108 (90 BASE) MCG/ACT inhaler Inhale 1-2 puffs into the lungs every 6 (six) hours as needed for wheezing or shortness of breath.   Yes [provider]  albuterol (PROVENTIL) (2.5 MG/3ML) 0.083% nebulizer solution Take 2.5 mg by nebulization every 6 (six) hours as needed for wheezing or shortness of breath.   Yes [provider]  allopurinol (ZYLOPRIM) 300 MG tablet Take 300 mg by mouth 2 (two) times daily.    Yes [provider]  aspirin EC 325 MG tablet Take 162 mg by mouth daily.    Yes [provider]  atorvastatin (LIPITOR) 20 MG tablet Take 20 mg by mouth every morning.    Yes [provider]  Colchicine (MITIGARE) 0.6 MG CAPS Take 1 capsule by mouth 2 (two) times a day.   Yes [provider]  fluticasone (FLONASE) 50 MCG/ACT nasal spray Place 1 spray into both nostrils daily as needed for allergies or rhinitis.   Yes [provider]  Fluticasone-Umeclidin-Vilant (TRELEGY ELLIPTA) 100-62.5-25 MCG/INH AEPB Inhale 1 puff into the lungs daily.   Yes [provider]  gabapentin (NEURONTIN) 400 MG capsule Take 400 mg by mouth 3 (three) times daily.    Yes [provider]  indomethacin (INDOCIN) 50 MG capsule Take 50 mg by mouth daily as needed for mild pain or moderate pain (GOUT FLARE).  06/22/18  Yes [provider]  losartan (COZAAR) 100 MG tablet Take 100 mg by mouth daily.   Yes [provider]  metoprolol succinate (TOPROL-XL) 100 MG 24 hr tablet Take 100 mg by mouth daily. Take with or immediately following a meal.   Yes [provider]  tamsulosin (FLOMAX) 0.4 MG CAPS capsule Take 0.4 mg by mouth daily after supper.   Yes [provider]  sulfamethoxazole-trimethoprim (BACTRIM DS) 800-160 MG tablet Take 1 tablet by mouth 2 (two) times daily for 7 days. 10/14/18 10/21/18  Donnetta Hutching, MD    Family History History reviewed. No pertinent family history.  Social History Social History  Tobacco Use  . Smoking status: Current Every Day Smoker    Packs/day: 0.50    Types: Cigarettes  . Smokeless tobacco: Never Used  Substance Use Topics  . Alcohol use: Not Currently    Comment: rarely  . Drug use: Yes    Types: Marijuana     Allergies   Effexor [venlafaxine hcl]   Review of Systems Review of Systems  All other systems reviewed and are negative.    Physical Exam Updated Vital Signs BP 123/89   Pulse 75   Temp 98.3 F (36.8 C) (Oral)   Resp 16   Ht 6' (1.829 m)   Wt (!) 167.8 kg   SpO2 96%   BMI 50.18 kg/m   Physical Exam Vitals signs and nursing note reviewed.  Constitutional:      Appearance: He is well-developed.     Comments: obese  HENT:     Head: Normocephalic and  atraumatic.  Eyes:     Conjunctiva/sclera: Conjunctivae normal.  Neck:     Musculoskeletal: Neck supple.  Cardiovascular:     Rate and Rhythm: Normal rate.  Pulmonary:     Effort: Pulmonary effort is normal.  Abdominal:     General: Bowel sounds are normal.     Palpations: Abdomen is soft.  Musculoskeletal: Normal range of motion.  Skin:    Comments: Obvious abscess on left perineal area approximately 4 to 5 cm's in diameter  Neurological:     Mental Status: He is alert and oriented to person, place, and time.  Psychiatric:        Behavior: Behavior normal.      ED Treatments / Results  Labs (all labs ordered are listed, but only abnormal results are displayed) Labs Reviewed - No data to display  EKG None  Radiology No results found.  Procedures .Marland Kitchen.Incision and Drainage Date/Time: 10/14/2018 4:30 PM Performed by: Donnetta Hutchingook, Jisella Ashenfelter, MD Authorized by: Donnetta Hutchingook, Finola Rosal, MD   Consent:    Consent obtained:  Verbal   Consent given by:  Patient   Risks discussed:  Incomplete drainage, pain and infection Location:    Type:  Abscess   Size:  4.0 cm   Location:  Anogenital   Anogenital location:  Perineum Pre-procedure details:    Skin preparation:  Betadine Anesthesia (see MAR for exact dosages):    Anesthesia method:  Local infiltration   Local anesthetic:  Lidocaine 2% WITH epi Procedure type:    Complexity:  Complex Procedure details:    Needle aspiration: no     Incision types:  Stab incision   Incision depth:  Subcutaneous   Scalpel blade:  11   Wound management:  Probed and deloculated and irrigated with saline   Drainage:  Purulent   Drainage amount:  Copious   Wound treatment:  Drain placed   Packing materials:  1/4 in iodoform gauze   Amount 1/4" iodoform:  4 cm Post-procedure details:    Patient tolerance of procedure:  Tolerated well, no immediate complications   (including critical care time)  Medications Ordered in ED Medications  lidocaine-EPINEPHrine  (XYLOCAINE W/EPI) 2 %-1:200000 (PF) injection 20 mL (20 mLs Infiltration Given 10/14/18 1625)  sulfamethoxazole-trimethoprim (BACTRIM DS) 800-160 MG per tablet 1 tablet (1 tablet Oral Given 10/14/18 1711)     Initial Impression / Assessment and Plan / ED Course  I have reviewed the triage vital signs and the nursing notes.  Pertinent labs & imaging results that were available during my care of the patient were  reviewed by me and considered in my medical decision making (see chart for details).        Obvious left perineal abscess drained via incision and drainage procedure.  Iodoform gauze placed.  Discharge medications Septra DS.  Final Clinical Impressions(s) / ED Diagnoses   Final diagnoses:  Abscess, perineum    ED Discharge Orders         Ordered    sulfamethoxazole-trimethoprim (BACTRIM DS) 800-160 MG tablet  2 times daily     10/14/18 1726           Donnetta Hutching, MD 10/14/18 [phone]

## 2018-10-14 NOTE — ED Triage Notes (Signed)
Seen by urology clinic in the last 7 days   Reports an abscess "between scrotum and rectum"  Reports has not taken HTN meds today

## 2018-12-15 ENCOUNTER — Encounter (HOSPITAL_COMMUNITY): Payer: Self-pay | Admitting: Emergency Medicine

## 2018-12-15 ENCOUNTER — Other Ambulatory Visit: Payer: Self-pay

## 2018-12-15 ENCOUNTER — Emergency Department (HOSPITAL_COMMUNITY)
Admission: EM | Admit: 2018-12-15 | Discharge: 2018-12-15 | Disposition: A | Discharge status: Home / Self Care | Payer: Medicaid Other | Attending: Emergency Medicine | Admitting: Emergency Medicine

## 2018-12-15 DIAGNOSIS — F17210 Nicotine dependence, cigarettes, uncomplicated: Secondary | ICD-10-CM | POA: Insufficient documentation

## 2018-12-15 DIAGNOSIS — K625 Hemorrhage of anus and rectum: Secondary | ICD-10-CM

## 2018-12-15 DIAGNOSIS — J449 Chronic obstructive pulmonary disease, unspecified: Secondary | ICD-10-CM | POA: Diagnosis not present

## 2018-12-15 DIAGNOSIS — Z79899 Other long term (current) drug therapy: Secondary | ICD-10-CM | POA: Diagnosis not present

## 2018-12-15 DIAGNOSIS — J45909 Unspecified asthma, uncomplicated: Secondary | ICD-10-CM | POA: Diagnosis not present

## 2018-12-15 DIAGNOSIS — L02416 Cutaneous abscess of left lower limb: Secondary | ICD-10-CM | POA: Insufficient documentation

## 2018-12-15 LAB — COMPREHENSIVE METABOLIC PANEL
ALT: 25 U/L (ref 0–44)
AST: 20 U/L (ref 15–41)
Albumin: 4 g/dL (ref 3.5–5.0)
Alkaline Phosphatase: 153 U/L — ABNORMAL HIGH (ref 38–126)
Anion gap: 11 (ref 5–15)
BUN: 15 mg/dL (ref 6–20)
CO2: 23 mmol/L (ref 22–32)
Calcium: 9.1 mg/dL (ref 8.9–10.3)
Chloride: 107 mmol/L (ref 98–111)
Creatinine, Ser: 1.11 mg/dL (ref 0.61–1.24)
GFR calc Af Amer: >60 mL/min (ref >60)
GFR calc non Af Amer: >60 mL/min (ref >60)
Glucose, Bld: 114 mg/dL — ABNORMAL HIGH (ref 70–99)
Potassium: 4.4 mmol/L (ref 3.5–5.1)
Sodium: 141 mmol/L (ref 135–145)
Total Bilirubin: 0.7 mg/dL (ref 0.3–1.2)
Total Protein: 7.3 g/dL (ref 6.5–8.1)

## 2018-12-15 LAB — CBC
HCT: 51 % (ref 39.0–52.0)
Hemoglobin: 16.5 g/dL (ref 13.0–17.0)
MCH: 30.2 pg (ref 26.0–34.0)
MCHC: 32.4 g/dL (ref 30.0–36.0)
MCV: 93.2 fL (ref 80.0–100.0)
Platelets: 255 10*3/uL (ref 150–400)
RBC: 5.47 MIL/uL (ref 4.22–5.81)
RDW: 13.2 % (ref 11.5–15.5)
WBC: 12.1 10*3/uL — ABNORMAL HIGH (ref 4.0–10.5)
nRBC: 0 % (ref 0.0–0.2)

## 2018-12-15 LAB — POC OCCULT BLOOD, ED: Fecal Occult Bld: POSITIVE — AB

## 2018-12-15 LAB — TYPE AND SCREEN
ABO/RH(D): A POS
Antibody Screen: NEGATIVE

## 2018-12-15 MED ORDER — SULFAMETHOXAZOLE-TRIMETHOPRIM 800-160 MG PO TABS: 1.0000 | ORAL_TABLET | Freq: Two times a day (BID) | ORAL | 0 refills | Status: AC

## 2018-12-15 MED ORDER — LIDOCAINE HCL (PF) 1 % IJ SOLN
5.0000 mL | Freq: Once | INTRAMUSCULAR | Status: DC
  Filled 2018-12-15: qty 6

## 2018-12-15 NOTE — Discharge Instructions (Addendum)
Bathe with a new washcloth each day. Use Phisoderm soap once weekly. Take bactrim as prescribed and complete the full course.  Follow up with GI for further evaluation of bleeding from the rectum. Return to the ER for worsening or concerning symptoms.

## 2018-12-15 NOTE — ED Triage Notes (Signed)
Patient reports passing dark red blood in his stool this am. C/O abscess just below his belt line and on his L inner thigh. Patient has been referred to [facility] previously but was unable to keep the appointment.

## 2018-12-15 NOTE — ED Provider Notes (Signed)
[facility] EMERGENCY DEPARTMENT Provider Note   CSN: [id number] Arrival date & time: 12/15/18  1039    History   Chief Complaint Chief Complaint  Patient presents with   Rectal Bleeding    HPI Darrell Lindsey is a 48 y.o. male.     48 year old male presents with complaint of abscess to his left posterior thigh as well as left lower abdominal area and notes bright red blood per rectum in the commode this morning.  Patient reports multiple abscesses recently, has been told MRSA negative.  Patient is using antibacterial soap and a loofah to base.  Reports left lower abdominal abscess was about the size of a grapefruit, has opened and drained however remains open with occasional drainage.  Left posterior thigh abscess for the past week, nondraining.  Patient has been evaluated in the ER for rectal bleed bleeding previously and referred to gastroenterology however due to COVID he has had difficulty with follow-up.  Patient denies feeling weak or dizzy, fevers, sweats, abdominal pain.  No other complaints or concerns.     Past Medical History:  Diagnosis Date   Anxiety    Asthma    Atrial flutter (HCC) 2012   Tx with ablation   CHF (congestive heart failure) (HCC)    Chronic back pain    COPD (chronic obstructive pulmonary disease) (HCC)    DOE (dyspnea on exertion)    chronic   Gout    Lumbar radiculopathy    OSA on CPAP    Renal disorder    kidney stones   Superficial thrombosis of leg     There are no active problems to display for this patient.   Past Surgical History:  Procedure Laterality Date   ADENOIDECTOMY     cardaic ablasion     LITHOTRIPSY     removal of salivary gland           Home Medications    Prior to Admission medications   Medication Sig Start Date End Date Taking? Authorizing Provider  albuterol (PROAIR HFA) 108 (90 BASE) MCG/ACT inhaler Inhale 1-2 puffs into the lungs every 6 (six) hours as needed for wheezing or  shortness of breath.   Yes [provider]  albuterol (PROVENTIL) (2.5 MG/3ML) 0.083% nebulizer solution Take 2.5 mg by nebulization every 6 (six) hours as needed for wheezing or shortness of breath.   Yes [provider]  allopurinol (ZYLOPRIM) 300 MG tablet Take 300 mg by mouth 2 (two) times daily.    Yes [provider]  aspirin EC 325 MG tablet Take 162.5 mg by mouth daily.    Yes [provider]  atorvastatin (LIPITOR) 20 MG tablet Take 20 mg by mouth every morning.    Yes [provider]  Colchicine (MITIGARE) 0.6 MG CAPS Take 1 capsule by mouth 2 (two) times a day.   Yes [provider]  fluticasone (FLONASE) 50 MCG/ACT nasal spray Place 1 spray into both nostrils daily as needed for allergies or rhinitis.   Yes [provider]  gabapentin (NEURONTIN) 400 MG capsule Take 400 mg by mouth 3 (three) times daily.    Yes [provider]  indomethacin (INDOCIN) 50 MG capsule Take 50 mg by mouth daily as needed for mild pain or moderate pain (GOUT FLARE).  06/22/18  Yes [provider]  losartan (COZAAR) 100 MG tablet Take 100 mg by mouth daily.   Yes [provider]  metoprolol succinate (TOPROL-XL) 100 MG 24 hr tablet  Take 100 mg by mouth daily. Take with or immediately following a meal.   Yes [provider]  tamsulosin (FLOMAX) 0.4 MG CAPS capsule Take 0.4 mg by mouth daily after supper.   Yes [provider]  Fluticasone-Umeclidin-Vilant (TRELEGY ELLIPTA) 100-62.5-25 MCG/INH AEPB Inhale 1 puff into the lungs daily.    [provider]  sulfamethoxazole-trimethoprim (BACTRIM DS) 800-160 MG tablet Take 1 tablet by mouth 2 (two) times daily for 10 days. 12/15/18 12/25/18  Jeannie FendMurphy, Mina Babula A, PA-C    Family History Family History  Problem Relation Age of Onset   Huntington's disease Mother    Heart disease Other    Diabetes Other     Social History Social History   Tobacco Use     Smoking status: Current Every Day Smoker    Packs/day: 0.50    Types: Cigarettes   Smokeless tobacco: Never Used  Substance Use Topics   Alcohol use: Not Currently    Comment: rarely   Drug use: Yes    Types: Marijuana     Allergies   Effexor [venlafaxine hcl]   Review of Systems Review of Systems  Constitutional: Negative for chills, diaphoresis and fever.  Respiratory: Negative for shortness of breath.   Cardiovascular: Negative for chest pain.  Gastrointestinal: Positive for blood in stool. Negative for abdominal pain, constipation, diarrhea, nausea and vomiting.  Musculoskeletal: Negative for arthralgias, joint swelling and myalgias.  Skin: Negative for rash and wound.  Allergic/Immunologic: Negative for immunocompromised state.  Neurological: Negative for dizziness, weakness and light-headedness.  Psychiatric/Behavioral: Negative for confusion.  All other systems reviewed and are negative.    Physical Exam Updated Vital Signs BP (!) 143/97 (BP Location: Right Arm)    Pulse 80    Temp 98.3 F (36.8 C) (Oral)    Resp 20    Ht 6' (1.829 m)    Wt (!) 167.8 kg    SpO2 99%    BMI 50.18 kg/m   Physical Exam Vitals signs and nursing note reviewed. Exam conducted with a chaperone present.  Constitutional:      General: He is not in acute distress.    Appearance: He is well-developed. He is obese. He is not diaphoretic.  HENT:     Head: Normocephalic and atraumatic.  Cardiovascular:     Pulses: Normal pulses.  Pulmonary:     Effort: Pulmonary effort is normal.  Abdominal:     Tenderness: There is no abdominal tenderness.     Comments: Limited due to girth  Genitourinary:    Rectum: Guaiac result positive. External hemorrhoid present. No anal fissure.     Comments: Grossly positive- pink stool Skin:    General: Skin is warm and dry.       Neurological:     Mental Status: He is alert and oriented to person, place, and time.  Psychiatric:        Behavior:  Behavior normal.      ED Treatments / Results  Labs (all labs ordered are listed, but only abnormal results are displayed) Labs Reviewed  COMPREHENSIVE METABOLIC PANEL - Abnormal; Notable for the following components:      Result Value   Glucose, Bld 114 (*)    Alkaline Phosphatase 153 (*)    All other components within normal limits  CBC - Abnormal; Notable for the following components:   WBC 12.1 (*)    All other components within normal limits  POC OCCULT BLOOD, ED - Abnormal; Notable for the following components:  Fecal Occult Bld POSITIVE (*)    All other components within normal limits  TYPE AND SCREEN    EKG None  Radiology No results found.  Procedures .Marland KitchenIncision and Drainage  Date/Time: 12/15/2018 3:00 PM Performed by: Tacy Learn, PA-C Authorized by: Tacy Learn, PA-C   Consent:    Consent obtained:  Verbal   Consent given by:  Patient   Risks discussed:  Bleeding, incomplete drainage, pain and damage to other organs   Alternatives discussed:  No treatment Universal protocol:    Procedure explained and questions answered to patient or proxy's satisfaction: yes     Relevant documents present and verified: yes     Test results available and properly labeled: yes     Imaging studies available: yes     Required blood products, implants, devices, and special equipment available: yes     Site/side marked: yes     Immediately prior to procedure a time out was called: yes     Patient identity confirmed:  Verbally with patient Location:    Type:  Abscess   Size:  4cm x 3cm   Location:  Lower extremity   Lower extremity location:  Leg   Leg location:  L upper leg Pre-procedure details:    Skin preparation:  Betadine Anesthesia (see MAR for exact dosages):    Anesthesia method:  Local infiltration   Local anesthetic:  Lidocaine 1% w/o epi Procedure type:    Complexity:  Complex Procedure details:    Incision types:  Single straight   Incision  depth:  Subcutaneous   Scalpel blade:  11   Wound management:  Probed and deloculated, irrigated with saline and extensive cleaning   Drainage:  Purulent and bloody   Drainage amount:  Moderate   Wound treatment:  Wound left open   Packing materials:  None Post-procedure details:    Patient tolerance of procedure:  Tolerated well, no immediate complications   (including critical care time)  Medications Ordered in ED Medications  lidocaine (PF) (XYLOCAINE) 1 % injection 5 mL (has no administration in time range)     Initial Impression / Assessment and Plan / ED Course  I have reviewed the triage vital signs and the nursing notes.  Pertinent labs & imaging results that were available during my care of the patient were reviewed by me and considered in my medical decision making (see chart for details).  Clinical Course as of Dec 15 1503  Fri Dec 14, 6873  [id number] 48 year old male with complaint of abscess to the posterior left thigh and bright red blood per rectum.  Both complaints are an ongoing problem for this patient, difficulty following with GI due to [place] pandemic.  On exam patient has a new abscess to the left posterior thigh, treated with I&D and discharged home on Bactrim.  On rectal exam patient has pink blood in the rectum which is Hemoccult positive, H&H stable compared to previous labs and patient referred to GI for follow-up.   [LM]    Clinical Course User Index [LM] Tacy Learn, PA-C      Final Clinical Impressions(s) / ED Diagnoses   Final diagnoses:  Abscess of left thigh  Rectal bleeding    ED Discharge Orders         Ordered    sulfamethoxazole-trimethoprim (BACTRIM DS) 800-160 MG tablet  2 times daily     12/15/18 1500           Tacy Learn, PA-C  12/15/18 1505    Eber HongMiller, Brian, MD 12/16/18 [phone]

## 2018-12-28 ENCOUNTER — Telehealth: Payer: Self-pay | Admitting: Internal Medicine

## 2018-12-28 ENCOUNTER — Encounter: Payer: Self-pay | Admitting: Internal Medicine

## 2018-12-28 ENCOUNTER — Ambulatory Visit: Payer: Medicaid Other | Admitting: Nurse Practitioner

## 2018-12-28 NOTE — Telephone Encounter (Signed)
Patient was a no show and letter sent  °

## 2018-12-28 NOTE — Progress Notes (Deleted)
Primary Care Physician:  Abran Richard, MD Primary Gastroenterologist:  Dr. Gala Romney  No chief complaint on file.   HPI:   Darrell Lindsey is a 48 y.o. male who presents on referral from primary care for rectal bleeding.  Reviewed information provided with referral including ***.  Patient was in the ER for rectal bleeding.  Endoscopy was performed with no obvious hemorrhoids or fissures, heme positive stool but no gross bright red blood per rectum.  History of colonoscopy in our system.  Today he states   Past Medical History:  Diagnosis Date  . Anxiety   . Asthma   . Atrial flutter ([place]) 2012   Tx with ablation  . CHF (congestive heart failure) ([place])   . Chronic back pain   . COPD (chronic obstructive pulmonary disease) ([place])   . DOE (dyspnea on exertion)    chronic  . Gout   . Lumbar radiculopathy   . OSA on CPAP   . Renal disorder    kidney stones  . Superficial thrombosis of leg     Past Surgical History:  Procedure Laterality Date  . ADENOIDECTOMY    . cardaic ablasion    . LITHOTRIPSY    . removal of salivary gland       Current Outpatient Medications  Medication Sig Dispense Refill  . albuterol (PROAIR HFA) 108 (90 BASE) MCG/ACT inhaler Inhale 1-2 puffs into the lungs every 6 (six) hours as needed for wheezing or shortness of breath.    Marland Kitchen albuterol (PROVENTIL) (2.5 MG/3ML) 0.083% nebulizer solution Take 2.5 mg by nebulization every 6 (six) hours as needed for wheezing or shortness of breath.    . allopurinol (ZYLOPRIM) 300 MG tablet Take 300 mg by mouth 2 (two) times daily.     Marland Kitchen aspirin EC 325 MG tablet Take 162.5 mg by mouth daily.     Marland Kitchen atorvastatin (LIPITOR) 20 MG tablet Take 20 mg by mouth every morning.     . Colchicine (MITIGARE) 0.6 MG CAPS Take 1 capsule by mouth 2 (two) times a day.    . fluticasone (FLONASE) 50 MCG/ACT nasal spray Place 1 spray into both nostrils daily as needed for allergies or rhinitis.    . Fluticasone-Umeclidin-Vilant  (TRELEGY ELLIPTA) 100-62.5-25 MCG/INH AEPB Inhale 1 puff into the lungs daily.    Marland Kitchen gabapentin (NEURONTIN) 400 MG capsule Take 400 mg by mouth 3 (three) times daily.     . indomethacin (INDOCIN) 50 MG capsule Take 50 mg by mouth daily as needed for mild pain or moderate pain (GOUT FLARE).     Marland Kitchen losartan (COZAAR) 100 MG tablet Take 100 mg by mouth daily.    . metoprolol succinate (TOPROL-XL) 100 MG 24 hr tablet Take 100 mg by mouth daily. Take with or immediately following a meal.    . tamsulosin (FLOMAX) 0.4 MG CAPS capsule Take 0.4 mg by mouth daily after supper.     No current facility-administered medications for this visit.     Allergies as of 12/28/2018 - Review Complete 12/15/2018  Allergen Reaction Noted  . Effexor [venlafaxine hcl] Hives 06/24/2012    Family History  Problem Relation Age of Onset  . Huntington's disease Mother   . Heart disease Other   . Diabetes Other     Social History   Socioeconomic History  . Marital status: Married    Spouse name: Not on file  . Number of children: Not on file  . Years of education: Not on file  .  Highest education level: Not on file  Occupational History  . Not on file  Social Needs  . Financial resource strain: Not on file  . Food insecurity    Worry: Not on file    Inability: Not on file  . Transportation needs    Medical: Not on file    Non-medical: Not on file  Tobacco Use  . Smoking status: Current Every Day Smoker    Packs/day: 0.50    Types: Cigarettes  . Smokeless tobacco: Never Used  Substance and Sexual Activity  . Alcohol use: Not Currently    Comment: rarely  . Drug use: Yes    Types: Marijuana  . Sexual activity: Not on file  Lifestyle  . Physical activity    Days per week: Not on file    Minutes per session: Not on file  . Stress: Not on file  Relationships  . Social Musicianconnections    Talks on phone: Not on file    Gets together: Not on file    Attends religious service: Not on file    Active  member of club or organization: Not on file    Attends meetings of clubs or organizations: Not on file    Relationship status: Not on file  . Intimate partner violence    Fear of current or ex partner: Not on file    Emotionally abused: Not on file    Physically abused: Not on file    Forced sexual activity: Not on file  Other Topics Concern  . Not on file  Social History Narrative  . Not on file    Review of Systems: General: Negative for anorexia, weight loss, fever, chills, fatigue, weakness. Eyes: Negative for vision changes.  ENT: Negative for hoarseness, difficulty swallowing , nasal congestion. CV: Negative for chest pain, angina, palpitations, dyspnea on exertion, peripheral edema.  Respiratory: Negative for dyspnea at rest, dyspnea on exertion, cough, sputum, wheezing.  GI: See history of present illness. GU:  Negative for dysuria, hematuria, urinary incontinence, urinary frequency, nocturnal urination.  MS: Negative for joint pain, low back pain.  Derm: Negative for rash or itching.  Neuro: Negative for weakness, abnormal sensation, seizure, frequent headaches, memory loss, confusion.  Psych: Negative for anxiety, depression, suicidal ideation, hallucinations.  Endo: Negative for unusual weight change.  Heme: Negative for bruising or bleeding. Allergy: Negative for rash or hives.    Physical Exam: There were no vitals taken for this visit. General:   Alert and oriented. Pleasant and cooperative. Well-nourished and well-developed.  Head:  Normocephalic and atraumatic. Eyes:  Without icterus, sclera clear and conjunctiva pink.  Ears:  Normal auditory acuity. Mouth:  No deformity or lesions, oral mucosa pink.  Throat/Neck:  Supple, without mass or thyromegaly. Cardiovascular:  S1, S2 present without murmurs appreciated. Normal pulses noted. Extremities without clubbing or edema. Respiratory:  Clear to auscultation bilaterally. No wheezes, rales, or rhonchi. No  distress.  Gastrointestinal:  +BS, soft, non-tender and non-distended. No HSM noted. No guarding or rebound. No masses appreciated.  Rectal:  Deferred  Musculoskalatal:  Symmetrical without gross deformities. Normal posture. Skin:  Intact without significant lesions or rashes. Neurologic:  Alert and oriented x4;  grossly normal neurologically. Psych:  Alert and cooperative. Normal mood and affect. Heme/Lymph/Immune: No significant cervical adenopathy. No excessive bruising noted.    12/28/2018 7:59 AM   Disclaimer: This note was dictated with voice recognition software. Similar sounding words can inadvertently be transcribed and may not be corrected upon review.

## 2019-08-20 ENCOUNTER — Ambulatory Visit: Payer: Medicaid Other | Admitting: Nutrition

## 2020-05-05 DIAGNOSIS — Z720 Tobacco use: Secondary | ICD-10-CM | POA: Insufficient documentation

## 2020-10-07 ENCOUNTER — Ambulatory Visit (INDEPENDENT_AMBULATORY_CARE_PROVIDER_SITE_OTHER): Payer: Medicaid Other | Admitting: Internal Medicine

## 2020-10-07 ENCOUNTER — Other Ambulatory Visit: Payer: Self-pay

## 2020-10-07 ENCOUNTER — Encounter: Payer: Self-pay | Admitting: Internal Medicine

## 2020-10-07 DIAGNOSIS — I10 Essential (primary) hypertension: Secondary | ICD-10-CM | POA: Diagnosis not present

## 2020-10-07 DIAGNOSIS — J449 Chronic obstructive pulmonary disease, unspecified: Secondary | ICD-10-CM

## 2020-10-07 DIAGNOSIS — F17210 Nicotine dependence, cigarettes, uncomplicated: Secondary | ICD-10-CM | POA: Insufficient documentation

## 2020-10-07 DIAGNOSIS — Z9989 Dependence on other enabling machines and devices: Secondary | ICD-10-CM

## 2020-10-07 DIAGNOSIS — G4733 Obstructive sleep apnea (adult) (pediatric): Secondary | ICD-10-CM

## 2020-10-07 MED ORDER — BISOPROLOL FUMARATE 10 MG PO TABS: 10.0000 mg | ORAL_TABLET | Freq: Every day | ORAL | 11 refills | Status: DC

## 2020-10-07 MED ORDER — SPIRIVA RESPIMAT 2.5 MCG/ACT IN AERS: 2.0000 | INHALATION_SPRAY | Freq: Every day | RESPIRATORY_TRACT | 0 refills | Status: DC

## 2020-10-07 MED ORDER — ALBUTEROL SULFATE HFA 108 (90 BASE) MCG/ACT IN AERS: INHALATION_SPRAY | RESPIRATORY_TRACT | 2 refills | Status: AC

## 2020-10-07 MED ORDER — SPIRIVA RESPIMAT 2.5 MCG/ACT IN AERS: INHALATION_SPRAY | RESPIRATORY_TRACT | 11 refills | Status: AC

## 2020-10-07 NOTE — Assessment & Plan Note (Signed)
Changed lopressor to bisoprolol 10/07/2020  In the setting of respiratory symptoms of unknown etiology,  It would be preferable to use bystolic, the most beta -1  selective Beta blocker available in sample form, with bisoprolol the most selective generic choice  on the market, at least on a trial basis, to make sure the spillover Beta 2 effects of the less specific Beta blockers are not contributing to this patient's symptoms.   >>> try bisoprolol 10 mg daily

## 2020-10-07 NOTE — Assessment & Plan Note (Signed)
rec as of 10/07/2020 restart cpap and refer to sleep medicine

## 2020-10-07 NOTE — Progress Notes (Signed)
Darrell Lindsey, male    DOB: February 12, 1971,    MRN: [id number]   Brief patient profile:  36 yowm active smoker always overweight  Kept up pretty well able to played football in HS at wt 240 max then AB in 20s p stared  smoking  On prn saba  then aflutter in 2012 and breathing worse since then ablation at [facility] ? still going in and out and dx with copd in [place]  And rx spiriva respimat worked the best but [place] no longer got insurance so referred to pulmonary clinic in [place]  10/07/2020 by Dr   Darrell Lindsey.  Prior to AF in 2012 wt was down 206 and "felt great"   Failed advair/ trelegy      History of Present Illness  10/07/2020  Pulmonary/ 1st office eval/ Darrell Lindsey / [place] [facility]  Chief Complaint  Patient presents with  . Pulmonary Consult    Referred by Dr Darrell Lindsey for eval of COPD. Pt c/o SOB since 2013, worse over the past 6 wks. He is having "coughing attacks"- non prod.  Dyspnea:  Room to room doe = baseline x years worse off all maint inhalers x last 6 weeks  Cough: mostly dry  Sleep: bed is flat 2 pillows  SABA use: hfa  not using now/ only used p exertion  - was using neb tid but not sure it hlped  cpap per sleep medicine at [facility] / does not feel it's working so stopped Aug 2021 Was on 2lpm hs but not needed while on cpap per pt  No obvious day to day or daytime variability or assoc excess/ purulent sputum or mucus plugs or hemoptysis or cp or chest tightness, subjective wheeze or overt sinus or hb symptoms.   sleeping without nocturnal  or early am exacerbation  of respiratory  c/o's or need for noct saba. Also denies any obvious fluctuation of symptoms with weather or environmental changes or other aggravating or alleviating factors except as outlined above   No unusual exposure hx or h/o childhood pna/ asthma or knowledge of premature birth.  Current Allergies, Complete Past Medical History, Past Surgical History, Family History, and Social History were  reviewed in [employer] record.  ROS  The following are not active complaints unless bolded Hoarseness, sore throat, dysphagia, dental problems, itching, sneezing,  nasal congestion or discharge of excess mucus or purulent secretions, ear ache,   fever, chills, sweats, unintended wt loss or wt gain, classically pleuritic or exertional cp,  orthopnea pnd or arm/hand swelling  or leg swelling, presyncope, palpitations, abdominal pain, anorexia, nausea, vomiting, diarrhea  or change in bowel habits or change in bladder habits, change in stools or change in urine, dysuria, hematuria,  rash, arthralgias, visual complaints, headache, numbness, weakness or ataxia or problems with walking or coordination,  change in mood or  memory.              Past Medical History:  Diagnosis Date  . Anxiety   . Asthma   . Atrial flutter (HCC) 2012   Tx with ablation  . CHF (congestive heart failure) (HCC)   . Chronic back pain   . COPD (chronic obstructive pulmonary disease) (HCC)   . DOE (dyspnea on exertion)    chronic  . Gout   . Lumbar radiculopathy   . OSA on CPAP   . Renal disorder    kidney stones  . Superficial thrombosis of leg     Outpatient Medications  Prior to Visit  Medication Sig Dispense Refill  . albuterol (PROVENTIL) (2.5 MG/3ML) 0.083% nebulizer solution Take 2.5 mg by nebulization every 6 (six) hours as needed for wheezing or shortness of breath.    Marland Kitchen albuterol (VENTOLIN HFA) 108 (90 Base) MCG/ACT inhaler Inhale 1-2 puffs into the lungs every 6 (six) hours as needed for wheezing or shortness of breath.    . allopurinol (ZYLOPRIM) 300 MG tablet Take 300 mg by mouth 2 (two) times daily.     Marland Kitchen aspirin EC 325 MG tablet Take 162.5 mg by mouth daily.     Marland Kitchen atorvastatin (LIPITOR) 20 MG tablet Take 20 mg by mouth every morning.     . Colchicine 0.6 MG CAPS Take 1 capsule by mouth 2 (two) times a day.    . gabapentin (NEURONTIN) 400 MG capsule Take 400 mg by mouth 3  (three) times daily.     . indomethacin (INDOCIN) 50 MG capsule Take 50 mg by mouth daily as needed for mild pain or moderate pain (GOUT FLARE).     Marland Kitchen losartan (COZAAR) 100 MG tablet Take 100 mg by mouth daily.    . metoprolol succinate (TOPROL-XL) 100 MG 24 hr tablet Take 100 mg by mouth daily. Take with or immediately following a meal.    . tamsulosin (FLOMAX) 0.4 MG CAPS capsule Take 0.4 mg by mouth daily after supper.    . fluticasone (FLONASE) 50 MCG/ACT nasal spray Place 1 spray into both nostrils daily as needed for allergies or rhinitis.    .              Objective:     BP (!) 162/94 (BP Location: Left Arm, Cuff Size: Large)   Pulse 90   Temp 98.1 F (36.7 C) (Temporal)   Ht 6' (1.829 m)   Wt (!) 395 lb (179.2 kg)   SpO2 99% Comment: on RA  BMI 53.57 kg/m   SpO2: 99 % (on RA)   Massively obese amb wm with marked pseudowheeze better with plm    HEENT : pt wearing mask not removed for exam due to covid -19 concerns.    NECK :  without JVD/Nodes/TM/ nl carotid upstrokes bilaterally   LUNGS: no acc muscle use,  Nl contour chest which is clear to A and P bilaterally without cough on insp or exp maneuvers   CV:  RRR  no s3 or murmur or increase in P2, and no edema   ABD:  Quite obese but soft and nontender with nl inspiratory excursion in the supine position. No bruits or organomegaly appreciated, bowel sounds nl  MS:  Nl gait/ ext warm without deformities, calf tenderness, cyanosis or clubbing No obvious joint restrictions   SKIN: warm and dry without lesions    NEURO:  alert, approp, nl sensorium with  no motor or cerebellar deficits apparent.    I personally reviewed images and agree with radiology impression as follows:   Chest CT s contrast 10/02/20 Mild diffuse bronchial wall thickening in keeping with airway  inflammation. No central obstructing lesion. No focal pulmonary  infiltrate.   Assessment   COPD GOLD ?  / active smoker  Active smoker -  onset of symptoms suggesting AB in his 55s  - PFTs 11/08/16  FE1 2.22 (59%) and ratio 64 but f/v loop only showed reduction in voluntary component  - 10/07/2020  After extensive coaching inhaler device,  effectiveness =    90% with smi > try restarting spiriva with judicious  use of saba as tends to afib/flutter  And changed high dose lopressor to bisoprolol   DDX of  difficult airways management almost all start with A and  include Adherence, Ace Inhibitors, Acid Reflux, Active Sinus Disease, Alpha 1 Antitripsin deficiency, Anxiety masquerading as Airways dz,  ABPA,  Allergy(esp in young), Aspiration (esp in elderly), Adverse effects of meds,  Active smoking or vaping, A bunch of PE's (a small clot burden can't cause this syndrome unless there is already severe underlying pulm or vascular dz with poor reserve) plus two Bs  = Bronchiectasis and Beta blocker use..and one C= CHF   Adherence is always the initial "prime suspect" and is a multilayered concern that requires a "trust but verify" approach in every patient - starting with knowing how to use medications, especially inhalers, correctly, keeping up with refills and understanding the fundamental difference between maintenance and prns vs those medications only taken for a very short course and then stopped and not refilled.  - see smi teaching  Active smoking top of the list of usual suspects > see sep a/p  ? Allergy/ asthma > use of steroids here problematic (wt gain) as is saba/laba (afib/flutter so for now just try spiriva.  ? Anxiety/depression /deconditioning/ wt gain > usually at the bottom of this list of usual suspects but   may interfere with adherence and also interpretation of response or lack thereof to symptom management which can be quite subjective.   ? Beta blocker effects > try bisoprolol (see sep ap)   Essential hypertension Changed lopressor to bisoprolol 10/07/2020  In the setting of respiratory symptoms of unknown etiology,   It would be preferable to use bystolic, the most beta -1  selective Beta blocker available in sample form, with bisoprolol the most selective generic choice  on the market, at least on a trial basis, to make sure the spillover Beta 2 effects of the less specific Beta blockers are not contributing to this patient's symptoms.   >>> try bisoprolol 10 mg daily     OSA on CPAP rec as of 10/07/2020 restart cpap and refer to sleep medicine     Cigarette smoker Counseled re importance of smoking cessation but did not meet time criteria for separate billing         Each maintenance medication was reviewed in detail including emphasizing most importantly the difference between maintenance and prns and under what circumstances the prns are to be triggered using an action plan format where appropriate.  Total time for H and P, chart review, counseling, reviewing smi device(s) and generating customized AVS unique to this office visit / same day charting = 52 min           Sandrea Hughs, MD 10/07/2020

## 2020-10-07 NOTE — Patient Instructions (Addendum)
The key is to stop smoking completely before smoking completely stops you!  Change the metoprolol to bisoprol 10 mg daily    Plan A = Automatic = Always=    Spriva 2 puffs each am   Plan B = Backup (to supplement plan A, not to replace it) Only use your albuterol inhaler as a rescue medication to be used if you can't catch your breath by resting or doing a relaxed purse lip breathing pattern.  - The less you use it, the better it will work when you need it. - Ok to use the inhaler up to 2 puffs  every 4 hours if you must but call for appointment if use goes up over your usual need - Don't leave home without it !!  (think of it like the spare tire for your car)   Plan C = Crisis (instead of Plan B but only if Plan B stops working) - only use your albuterol nebulizer if you first try Plan B and it fails to help > ok to use the nebulizer up to every 4 hours but if start needing it regularly call for immediate appointment  Also ok Try albuterol (first the inhaler then the neb)  15 min before an activity that you know would make you short of breath and see if it makes any difference and if makes none then don't take it after activity unless you can't catch your breath.  We will set you up to return here to see a sleep doctor here next available and I will be happy to see you as needed

## 2020-10-07 NOTE — Assessment & Plan Note (Addendum)
Active smoker - onset of symptoms suggesting AB in his 18s  - PFTs 11/08/16  FE1 2.22 (59%) and ratio 64 but f/v loop only showed reduction in voluntary component  - 10/07/2020  After extensive coaching inhaler device,  effectiveness =    90% with smi > try restarting spiriva with judicious use of saba as tends to afib/flutter  And changed high dose lopressor to bisoprolol    DDX of  difficult airways management almost all start with A and  include Adherence, Ace Inhibitors, Acid Reflux, Active Sinus Disease, Alpha 1 Antitripsin deficiency, Anxiety masquerading as Airways dz,  ABPA,  Allergy(esp in young), Aspiration (esp in elderly), Adverse effects of meds,  Active smoking or vaping, A bunch of PE's (a small clot burden can't cause this syndrome unless there is already severe underlying pulm or vascular dz with poor reserve) plus two Bs  = Bronchiectasis and Beta blocker use..and one C= CHF   Adherence is always the initial "prime suspect" and is a multilayered concern that requires a "trust but verify" approach in every patient - starting with knowing how to use medications, especially inhalers, correctly, keeping up with refills and understanding the fundamental difference between maintenance and prns vs those medications only taken for a very short course and then stopped and not refilled.  - see smi teaching  Active smoking top of the list of usual suspects > see sep a/p  ? Allergy/ asthma > use of steroids here problematic (wt gain) as is saba/laba (afib/flutter so for now just try spiriva.  ? Anxiety/depression /deconditioning/ wt gain > usually at the bottom of this list of usual suspects but   may interfere with adherence and also interpretation of response or lack thereof to symptom management which can be quite subjective.   ? Beta blocker effects > try bisoprolol (see sep ap)

## 2020-10-07 NOTE — Assessment & Plan Note (Signed)
Counseled re importance of smoking cessation but did not meet time criteria for separate billing            Each maintenance medication was reviewed in detail including emphasizing most importantly the difference between maintenance and prns and under what circumstances the prns are to be triggered using an action plan format where appropriate.  Total time for H and P, chart review, counseling, reviewing smi device(s) and generating customized AVS unique to this office visit / same day charting = 52 min

## 2020-10-08 ENCOUNTER — Telehealth: Payer: Self-pay | Admitting: Internal Medicine

## 2020-10-08 NOTE — Telephone Encounter (Signed)
Received a PA request for bisoprolol 10mg . Attempted PA but was told that the PA had a high chance of being denied if the patient has not tried or failed two of the following medications: atenolol, carvedilol, labetalol, propranolol or metoprolol. Patient was already on metoprolol before seeing MW.   MW, please advise if you want to switch him to one of the covered medications mentioned above. Thanks!

## 2020-10-08 NOTE — Telephone Encounter (Signed)
PA was started on [email or web address]. Key is [id number].   No timeframe was given for PA. Will continue to monitor for updates. Will route to [place] for follow up.

## 2020-10-08 NOTE — Telephone Encounter (Signed)
Do PA  Failed metaprolol due to asthma and atenolol has same potential problem

## 2020-10-09 DIAGNOSIS — R109 Unspecified abdominal pain: Secondary | ICD-10-CM | POA: Insufficient documentation

## 2020-10-09 NOTE — Telephone Encounter (Signed)
Per [email or web address], PA was approved for generic only. No time limit was given.   Pharmacy is aware of the approval.   Nothing further needed.

## 2020-10-30 ENCOUNTER — Ambulatory Visit (INDEPENDENT_AMBULATORY_CARE_PROVIDER_SITE_OTHER): Payer: Medicaid Other | Admitting: Pulmonary Disease

## 2020-10-30 ENCOUNTER — Encounter: Payer: Self-pay | Admitting: Pulmonary Disease

## 2020-10-30 ENCOUNTER — Other Ambulatory Visit: Payer: Self-pay

## 2020-10-30 VITALS — BP 150/94 | HR 65 | Temp 97.1°F | Ht 72.0 in | Wt 397.0 lb

## 2020-10-30 DIAGNOSIS — G4733 Obstructive sleep apnea (adult) (pediatric): Secondary | ICD-10-CM | POA: Diagnosis not present

## 2020-10-30 DIAGNOSIS — J449 Chronic obstructive pulmonary disease, unspecified: Secondary | ICD-10-CM | POA: Diagnosis not present

## 2020-10-30 DIAGNOSIS — Z9989 Dependence on other enabling machines and devices: Secondary | ICD-10-CM | POA: Diagnosis not present

## 2020-10-30 DIAGNOSIS — F17210 Nicotine dependence, cigarettes, uncomplicated: Secondary | ICD-10-CM | POA: Diagnosis not present

## 2020-10-30 NOTE — Assessment & Plan Note (Signed)
I emphasized getting back on his CPAP.  He seems to have supplies that he needs and we discussed care of CPAP machine and supplies.  Once he is compliant we can review download on his machine and tweak settings if needed. Based on inspire titration he does not seem to require oxygen but we can reassess this in the future

## 2020-10-30 NOTE — Assessment & Plan Note (Signed)
We will reassess with PFTs, his smoking history does not seem to be substantial but prior PFTs by report seem to have shown airway obstruction. He he received the most benefit from Spiriva compared to Symbicort.  He could not tolerate Trelegy due to powder intolerance.  If he does have significant airway obstruction I would at least like him to be on an inhaled steroid so would prefer Symbicort or Breztri

## 2020-10-30 NOTE — Assessment & Plan Note (Signed)
Once again emphasized smoking cessation is the most important intervention for his lung disease

## 2020-10-30 NOTE — Patient Instructions (Addendum)
Get back on your CPAP machine We will get you supplies as needed  You have to QUIT smoking !!  Schedule pFTs

## 2020-10-30 NOTE — Progress Notes (Signed)
Subjective:    Patient ID: Darrell Lindsey, male    DOB: 1970/10/04, 50 y.o.   MRN: [id number]  HPI  50 year old morbidly obese man presents to establish care for "COPD" and OSA He has seen my partner Dr. Sherene Sires 10/07/2020 for COPD and referred to me for sleep evaluation.  He smoked half pack per day starting at age 52 but then quit from age [age] when he started again due to stressors, about 10 pack years His weight was mostly maintained at 240 pounds, he lost significant weight up to 206 in 2012 but then developed atrial flutter and that is when his breathing problem started.  He has been following at [facility] pulmonary and has been maintained on multiple medications including Symbicort Trelegy but Spiriva seems to work the best.  I note PFTs from 2018 showed airway obstruction but I am unable to track this PFT down myself. He is accompanied by his wife Darrell Lindsey who corroborates the history.  Epworth sleepiness score is 11 and reports sleepiness while sitting and reading, watching TV or as a passenger in a car.  Loud snoring has been noted by his wife and she has witnessed apneas.  OSA was diagnosed in 2018 and he was placed on CPAP of 10 cm with a large full facemask with good improvement in his daytime somnolence and fatigue however a year ago he went traveling and misplaced his CPAP machine and has not used it since then.  He was just able to find it.  He also has CPAP supplies including an unused hose  Bedtime is between 10 PM and midnight, TV stays on through the night, sleep latency can be 1 to 2 hours, he sleeps on his side with 2 pillows, reports 3-4 nocturnal awakenings and is up anywhere between 8 and 10 AM feeling tired with dryness of mouth and headaches There is no history suggestive of cataplexy, sleep paralysis or parasomnias He was provided with oxygen at some point but following CPAP titration he was asked to stop using oxygen, he still has the oxygen at home  PMH -atrial flutter  status post ablation 2012, CKD, renal calculi,HF pEF ,    Significant tests/ events reviewed  PFTs 10/2016  FEV1 2.22 (59%) and ratio 64 Split PSG 07/2016 >> 360 lbs, AHI 31/hour RDI 49/hour supine AHI 74/hour, corrected by CPAP 10 cm with large full facemask    Past Medical History:  Diagnosis Date  . Anxiety   . Asthma   . Atrial flutter (HCC) 2012   Tx with ablation  . CHF (congestive heart failure) (HCC)   . Chronic back pain   . COPD (chronic obstructive pulmonary disease) (HCC)   . DOE (dyspnea on exertion)    chronic  . Gout   . Lumbar radiculopathy   . OSA on CPAP   . Renal disorder    kidney stones  . Superficial thrombosis of leg     Past Surgical History:  Procedure Laterality Date  . ADENOIDECTOMY    . cardaic ablasion    . LITHOTRIPSY    . removal of salivary gland       Allergies  Allergen Reactions  . Effexor [Venlafaxine Hcl] Hives    Social History   Socioeconomic History  . Marital status: Married    Spouse name: Not on file  . Number of children: Not on file  . Years of education: Not on file  . Highest education level: Not on file  Occupational History  .  Not on file  Tobacco Use  . Smoking status: Current Every Day Smoker    Packs/day: 0.50    Years: 15.00    Pack years: 7.50    Types: Cigarettes  . Smokeless tobacco: Never Used  . Tobacco comment: smokes 1/2 pack per day 10/30/20  Vaping Use  . Vaping Use: Former  Substance and Sexual Activity  . Alcohol use: Not Currently    Comment: rarely  . Drug use: Yes    Types: Marijuana  . Sexual activity: Not on file  Other Topics Concern  . Not on file  Social History Narrative  . Not on file   Social Determinants of Health   Financial Resource Strain: Not on file  Food Insecurity: Not on file  Transportation Needs: Not on file  Physical Activity: Not on file  Stress: Not on file  Social Connections: Not on file  Intimate Partner Violence: Not on file    Family History   Problem Relation Age of Onset  . Huntington's disease Mother   . Heart disease Other   . Diabetes Other     Review of Systems  Positive for dyspnea on exertion Lower extremity edema Tooth problems Anxiety and depression Joint stiffness  Constitutional: negative for anorexia, fevers and sweats  Eyes: negative for irritation, redness and visual disturbance  Ears, nose, mouth, throat, and face: negative for earaches, epistaxis, nasal congestion and sore throat  Respiratory: negative for cough,  sputum and wheezing  Cardiovascular: negative for chest pain,  orthopnea, palpitations and syncope  Gastrointestinal: negative for abdominal pain, constipation, diarrhea, melena, nausea and vomiting  Genitourinary:negative for dysuria, frequency and hematuria  Hematologic/lymphatic: negative for bleeding, easy bruising and lymphadenopathy  Musculoskeletal:negative for arthralgias, muscle weakness   Neurological: negative for coordination problems, gait problems, headaches and weakness  Endocrine: negative for diabetic symptoms including polydipsia, polyuria and weight loss     Objective:   Physical Exam  Gen. Pleasant, obese, in no distress, normal affect ENT - no pallor,icterus, no post nasal drip, class 2-3 airway Neck: No JVD, no thyromegaly, no carotid bruits Lungs: no use of accessory muscles, no dullness to percussion, decreased without rales or rhonchi  Cardiovascular: Rhythm regular, heart sounds  normal, no murmurs or gallops, no peripheral edema Abdomen: soft and non-tender, no hepatosplenomegaly, BS normal. Musculoskeletal: No deformities, no cyanosis or clubbing Neuro:  alert, non focal, no tremors       Assessment & Plan:

## 2021-03-20 ENCOUNTER — Other Ambulatory Visit (HOSPITAL_COMMUNITY)
Admission: RE | Admit: 2021-03-20 | Discharge: 2021-03-20 | Disposition: A | Discharge status: Home / Self Care | Payer: Medicaid Other | Source: Ambulatory Visit | Attending: Pulmonary Disease | Admitting: Pulmonary Disease

## 2021-03-23 ENCOUNTER — Other Ambulatory Visit (HOSPITAL_COMMUNITY): Admission: RE | Admit: 2021-03-23 | Payer: Medicaid Other | Source: Ambulatory Visit

## 2021-03-24 ENCOUNTER — Ambulatory Visit (HOSPITAL_COMMUNITY): Admission: RE | Admit: 2021-03-24 | Payer: Medicaid Other | Source: Ambulatory Visit

## 2021-07-17 ENCOUNTER — Other Ambulatory Visit (HOSPITAL_COMMUNITY): Payer: Medicaid Other

## 2021-07-21 ENCOUNTER — Ambulatory Visit (HOSPITAL_COMMUNITY): Payer: Medicaid Other

## 2021-07-30 ENCOUNTER — Telehealth: Payer: Self-pay

## 2021-07-30 NOTE — Telephone Encounter (Signed)
Noted. Will make Dr. Elsworth Soho aware.

## 2021-07-31 ENCOUNTER — Ambulatory Visit: Payer: Medicaid Other | Admitting: Pulmonary Disease

## 2021-10-02 ENCOUNTER — Ambulatory Visit: Payer: Medicaid Other | Admitting: Pulmonary Disease

## 2021-10-19 ENCOUNTER — Ambulatory Visit: Payer: Medicaid Other | Admitting: Pulmonary Disease

## 2022-05-21 DIAGNOSIS — I2699 Other pulmonary embolism without acute cor pulmonale: Secondary | ICD-10-CM

## 2022-05-21 HISTORY — DX: Other pulmonary embolism without acute cor pulmonale: I26.99

## 2022-05-25 ENCOUNTER — Emergency Department (HOSPITAL_COMMUNITY)
Admission: EM | Admit: 2022-05-25 | Discharge: 2022-05-25 | Discharge status: Against Medical Advice | Payer: Medicaid Other | Attending: Emergency Medicine | Admitting: Emergency Medicine

## 2022-05-25 ENCOUNTER — Encounter (HOSPITAL_COMMUNITY): Payer: Self-pay | Admitting: Emergency Medicine

## 2022-05-25 ENCOUNTER — Emergency Department (HOSPITAL_COMMUNITY): Payer: Medicaid Other

## 2022-05-25 DIAGNOSIS — I110 Hypertensive heart disease with heart failure: Secondary | ICD-10-CM | POA: Diagnosis not present

## 2022-05-25 DIAGNOSIS — R0602 Shortness of breath: Secondary | ICD-10-CM | POA: Insufficient documentation

## 2022-05-25 DIAGNOSIS — I509 Heart failure, unspecified: Secondary | ICD-10-CM | POA: Insufficient documentation

## 2022-05-25 DIAGNOSIS — Z5321 Procedure and treatment not carried out due to patient leaving prior to being seen by health care provider: Secondary | ICD-10-CM | POA: Insufficient documentation

## 2022-05-25 DIAGNOSIS — R0789 Other chest pain: Secondary | ICD-10-CM | POA: Diagnosis present

## 2022-05-25 DIAGNOSIS — R42 Dizziness and giddiness: Secondary | ICD-10-CM | POA: Insufficient documentation

## 2022-05-25 DIAGNOSIS — M79622 Pain in left upper arm: Secondary | ICD-10-CM | POA: Insufficient documentation

## 2022-05-25 LAB — BASIC METABOLIC PANEL
Anion gap: 10 (ref 5–15)
BUN: 15 mg/dL (ref 6–20)
CO2: 21 mmol/L — ABNORMAL LOW (ref 22–32)
Calcium: 9 mg/dL (ref 8.9–10.3)
Chloride: 108 mmol/L (ref 98–111)
Creatinine, Ser: 1.03 mg/dL (ref 0.61–1.24)
GFR, Estimated: >60 mL/min (ref >60)
Glucose, Bld: 196 mg/dL — ABNORMAL HIGH (ref 70–99)
Potassium: 3.9 mmol/L (ref 3.5–5.1)
Sodium: 139 mmol/L (ref 135–145)

## 2022-05-25 LAB — CBC
HCT: 48.3 % (ref 39.0–52.0)
Hemoglobin: 16 g/dL (ref 13.0–17.0)
MCH: 29.5 pg (ref 26.0–34.0)
MCHC: 33.1 g/dL (ref 30.0–36.0)
MCV: 89.1 fL (ref 80.0–100.0)
Platelets: 226 10*3/uL (ref 150–400)
RBC: 5.42 MIL/uL (ref 4.22–5.81)
RDW: 14.1 % (ref 11.5–15.5)
WBC: 11.5 10*3/uL — ABNORMAL HIGH (ref 4.0–10.5)
nRBC: 0 % (ref 0.0–0.2)

## 2022-05-25 LAB — BRAIN NATRIURETIC PEPTIDE: B Natriuretic Peptide: 59.9 pg/mL (ref 0.0–100.0)

## 2022-05-25 LAB — TROPONIN I (HIGH SENSITIVITY): Troponin I (High Sensitivity): 44 ng/L — ABNORMAL HIGH (ref <18)

## 2022-05-25 NOTE — ED Notes (Signed)
X1 no response for vitals update 

## 2022-05-25 NOTE — ED Provider Triage Note (Signed)
Emergency Medicine Provider Triage Evaluation Note  Darrell Lindsey , a 51 y.o. male  was evaluated in triage.  Pt complains of central chest pain radiating to left shoulder starting about 30 minutes ago.  Describes it as sharp.  Is associated dizziness and shortness of breath.  Never had this pain before.  He has a history of multiple blockages in his heart that never been stented, history of CHF.Marland Kitchen  He has a history of hypertension and has not taken his medications in 1 week.  Review of Systems  Positive:   Negative:    Physical Exam  BP (!) 177/93   Pulse 90   Temp 97.6 F (36.4 C) (Oral)   Resp (!) 21   Ht 6' (1.829 m)   Wt (!) 180 kg   SpO2 98%   BMI 53.82 kg/m  Gen:   Awake, no distress    Resp:  Normal effort  MSK:   Moves extremities without difficulty  Other:  Pulses equal, 1 + LE pitting edema  Medical Decision Making  Medically screening exam initiated at 1:12 PM.  Appropriate orders placed.  Darrell Lindsey was informed that the remainder of the evaluation will be completed by another provider, this initial triage assessment does not replace that evaluation, and the importance of remaining in the ED until their evaluation is complete.     Darrell Leach, PA-C 05/25/22 1313

## 2022-05-25 NOTE — ED Notes (Signed)
X2 no response °

## 2022-05-25 NOTE — ED Triage Notes (Signed)
Pt reports CP starting this morning that worsened. Pain also to left arm. Endorses SOB and dizziness. Pt has not taken HTN meds in a week.

## 2022-07-29 ENCOUNTER — Telehealth: Payer: Self-pay | Admitting: Nutrition

## 2022-07-29 ENCOUNTER — Ambulatory Visit: Payer: Medicaid Other | Admitting: Nutrition

## 2022-08-18 ENCOUNTER — Other Ambulatory Visit: Payer: Self-pay | Admitting: *Deleted

## 2022-08-18 DIAGNOSIS — L02215 Cutaneous abscess of perineum: Secondary | ICD-10-CM

## 2022-08-18 NOTE — Telephone Encounter (Signed)
No show, vm left

## 2022-08-24 ENCOUNTER — Encounter: Payer: Self-pay | Admitting: Surgery

## 2022-08-24 ENCOUNTER — Ambulatory Visit (INDEPENDENT_AMBULATORY_CARE_PROVIDER_SITE_OTHER): Payer: Medicaid Other | Admitting: Surgery

## 2022-08-24 VITALS — BP 148/77 | HR 56 | Temp 97.8°F | Resp 18 | Ht 72.0 in | Wt >= 6400 oz

## 2022-08-24 DIAGNOSIS — L02215 Cutaneous abscess of perineum: Secondary | ICD-10-CM | POA: Diagnosis not present

## 2022-08-24 DIAGNOSIS — K644 Residual hemorrhoidal skin tags: Secondary | ICD-10-CM

## 2022-08-24 NOTE — Progress Notes (Signed)
[facility] History and Physical  Reason for Referral:*** Referring Physician: ***  Chief Complaint   New Patient (Initial Visit)     Darrell Lindsey is a 52 y.o. male.  HPI: ***.  The *** started *** and has had a duration of ***.  It is associated with ***.  The *** is improved with ***, and is made worse with ***.    Quality*** Context***  Past Medical History:  Diagnosis Date  . Anxiety   . Asthma   . Atrial flutter ([place]) 2012   Tx with ablation  . CHF (congestive heart failure) ([place])   . Chronic back pain   . COPD (chronic obstructive pulmonary disease) ([place])   . DOE (dyspnea on exertion)    chronic  . Gout   . Lumbar radiculopathy   . OSA on CPAP   . Renal disorder    kidney stones  . Superficial thrombosis of leg     Past Surgical History:  Procedure Laterality Date  . ADENOIDECTOMY    . cardaic ablasion    . LITHOTRIPSY    . removal of salivary gland       Family History  Problem Relation Age of Onset  . Huntington's disease Mother   . Heart disease Other   . Diabetes Other     Social History   Tobacco Use  . Smoking status: Every Day    Packs/day: 0.50    Years: 15.00    Total pack years: 7.50    Types: Cigarettes  . Smokeless tobacco: Never  . Tobacco comments:    smokes 1/2 pack per day 10/30/20  Vaping Use  . Vaping Use: Former  Substance Use Topics  . Alcohol use: Not Currently    Comment: rarely  . Drug use: Yes    Types: Marijuana    Medications: {medication reviewed/display:3041432} Allergies as of 08/24/2022       Reactions   Effexor [venlafaxine Hcl] Hives        Medication List        Accurate as of August 24, 2022 10:52 AM. If you have any questions, ask your nurse or doctor.          STOP taking these medications    atorvastatin 20 MG tablet Commonly known as: LIPITOR Stopped by: Darrell Lindsey   bisoprolol 10 MG tablet Commonly known as: ZEBETA Stopped by: Darrell Lindsey   gabapentin 400 MG capsule Commonly known as: NEURONTIN Stopped by: Darrell Lindsey   HYDROcodone-acetaminophen 10-325 MG tablet Commonly known as: NORCO Stopped by: Darrell Lindsey   indomethacin 50 MG capsule Commonly known as: INDOCIN Stopped by: Darrell Lindsey   spironolactone 25 MG tablet Commonly known as: ALDACTONE Stopped by: Darrell Lindsey   tamsulosin 0.4 MG Caps capsule Commonly known as: FLOMAX Stopped by: Darrell Lindsey       TAKE these medications    albuterol (2.5 MG/3ML) 0.083% nebulizer solution Commonly known as: PROVENTIL Take 2.5 mg by nebulization every 6 (six) hours as needed for wheezing or shortness of breath.   albuterol 108 (90 Base) MCG/ACT inhaler Commonly known as: ProAir HFA 2 puffs every 4 hours as needed only  if your can't catch your breath   allopurinol 300 MG tablet Commonly known as: ZYLOPRIM Take 300 mg by mouth 2 (two) times daily.   amLODipine 10 MG tablet Commonly known as: NORVASC Take 10 mg by mouth  daily.   aspirin EC 325 MG tablet Take 162.5 mg by mouth daily.   Colchicine 0.6 MG Caps Take 1 capsule by mouth 2 (two) times a day.   Eliquis 5 MG Tabs tablet Generic drug: apixaban Take 5 mg by mouth 2 (two) times daily.   losartan 100 MG tablet Commonly known as: COZAAR Take 100 mg by mouth daily.   metoprolol succinate 100 MG 24 hr tablet Commonly known as: TOPROL-XL Take 100 mg by mouth daily.   rosuvastatin 20 MG tablet Commonly known as: CRESTOR Take 20 mg by mouth daily.   Spiriva Respimat 2.5 MCG/ACT Aers Generic drug: Tiotropium Bromide Monohydrate 2 puffs each am         ROS:  {Review of Systems:30496}  Blood pressure (!) 148/77, pulse (!) 56, temperature 97.8 F (36.6 C), temperature source Oral, resp. rate 18, height 6' (1.829 m), weight (!) 400 lb (181.4 kg), SpO2 93 %. Physical Exam  Results: No results found  for this or any previous visit (from the past 48 hour(s)).  No results found.   Assessment & Plan:  Darrell Lindsey is a 52 y.o. male with *** -*** -*** -Follow up ***  All questions were answered to the satisfaction of the patient and family***.  The risk and benefits of *** were discussed including but not limited to ***.  After careful consideration, Darrell Lindsey has decided to ***.    Abrish Erny A Romana Deaton 08/24/2022, 10:52 AM   Graciella Freer, Lindsey [facility] [address] [place], [place] [address] [phone] (office)

## 2022-09-07 DIAGNOSIS — M47812 Spondylosis without myelopathy or radiculopathy, cervical region: Secondary | ICD-10-CM | POA: Insufficient documentation

## 2022-09-07 DIAGNOSIS — M48061 Spinal stenosis, lumbar region without neurogenic claudication: Secondary | ICD-10-CM | POA: Insufficient documentation

## 2022-09-07 DIAGNOSIS — M5137 Other intervertebral disc degeneration, lumbosacral region: Secondary | ICD-10-CM | POA: Insufficient documentation

## 2022-09-07 DIAGNOSIS — I2699 Other pulmonary embolism without acute cor pulmonale: Secondary | ICD-10-CM | POA: Insufficient documentation

## 2022-11-25 ENCOUNTER — Encounter (INDEPENDENT_AMBULATORY_CARE_PROVIDER_SITE_OTHER): Payer: Self-pay | Admitting: *Deleted

## 2023-01-10 ENCOUNTER — Encounter (INDEPENDENT_AMBULATORY_CARE_PROVIDER_SITE_OTHER): Payer: Self-pay | Admitting: *Deleted

## 2023-01-31 ENCOUNTER — Ambulatory Visit (INDEPENDENT_AMBULATORY_CARE_PROVIDER_SITE_OTHER): Payer: Medicaid Other | Admitting: Gastroenterology

## 2023-01-31 ENCOUNTER — Telehealth (INDEPENDENT_AMBULATORY_CARE_PROVIDER_SITE_OTHER): Payer: Self-pay

## 2023-01-31 ENCOUNTER — Encounter (INDEPENDENT_AMBULATORY_CARE_PROVIDER_SITE_OTHER): Payer: Self-pay | Admitting: Gastroenterology

## 2023-01-31 VITALS — BP 130/85 | HR 56 | Temp 97.8°F | Ht 72.0 in | Wt 396.2 lb

## 2023-01-31 DIAGNOSIS — K625 Hemorrhage of anus and rectum: Secondary | ICD-10-CM | POA: Diagnosis not present

## 2023-01-31 DIAGNOSIS — K643 Fourth degree hemorrhoids: Secondary | ICD-10-CM

## 2023-01-31 MED ORDER — HYDROCORTISONE (PERIANAL) 2.5 % EX CREA: 1.0000 | TOPICAL_CREAM | Freq: Three times a day (TID) | CUTANEOUS | 1 refills | Status: AC

## 2023-01-31 NOTE — Progress Notes (Addendum)
Referring Provider: Alvina Filbert, MD Primary Care Physician:  Alvina Filbert, MD Primary GI Physician: New (Dr. Levon Hedger)  Chief Complaint  Patient presents with   Diarrhea    Referred for diarrhea and blood in stool. Having issues with hemorrhoid. Tried prescription and otc creams. States he passed right much blood this morning and yesterday. Has seen dark and bright red blood. Would like to discuss hemorrhoid banding.     HPI:   Darrell Lindsey is a 52 y.o. male with past medical history of anxiety, asthma, A flutter (ablation previously), CHF, COPD, Gout, OSA on CPAP, DVT on eliquis  Patient presenting today for diarrhea, rectal bleeding and hemorrhoids  He notes that he has pretty soft stools at baseline, sometimes diarrhea though this is not new for him, no changes in bowel habits. He has been dealing with hemorrhoids for about 2 years, he has some bleeding that he notes in the toilet when this occurs, can be heavier in volume at times. He uses a bidet so tries to avoid wiping a lot to avoid irritation. Denies any pain, itching, burning unless he goes out and has to have A BM and does not feel clean enough.  He is not currently using anything for his hemorrhoids, has tried OTC and a prescription foam medication without much relief. He has a BM usually 2-3x/day. Denies abdominal pain.   He notes he was having blood in stools previously, last colonoscopy was more than 5 years ago done at [facility]. He states at that time told he was "bursting capillaries when having a BM."  He denies any current melena, had some darker stool coloring a few months back but does not think they were black. He also endorses history of perianal abscess, most recently in march 2024, seen by general surgery at that time. Was told hemorrhoidectomy was not recommended due to difficult recovery.   Denies unintended weight loss or changes in appetite. He is trying to get on a weight loss injection to help with weight  loss.   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  NSAID use: on occasion  Social hx: smokes 1/2 PPD, rare etoh  Fam hx: denies CRC, liver disease or pancreatic cancer   Last Colonoscopy:>5 years ago, done at [facility] Last Endoscopy: one in the past, done within the past 10 years, done at [facility]   Recommendations:    Past Medical History:  Diagnosis Date   Anxiety    Asthma    Atrial flutter ([facility]) 2012   Tx with ablation   CHF (congestive heart failure) ([facility])    Chronic back pain    COPD (chronic obstructive pulmonary disease) ([facility])    DOE (dyspnea on exertion)    chronic   Gout    Lumbar radiculopathy    OSA on CPAP    Renal disorder    kidney stones   Superficial thrombosis of leg     Past Surgical History:  Procedure Laterality Date   ADENOIDECTOMY     cardaic ablasion     LITHOTRIPSY     removal of salivary gland       Current Outpatient Medications  Medication Sig Dispense Refill   albuterol (PROAIR HFA) 108 (90 Base) MCG/ACT inhaler 2 puffs every 4 hours as needed only  if your can't catch your breath 18 g 2   allopurinol (ZYLOPRIM) 300 MG tablet Take 300 mg by mouth 2 (two) times daily.      amLODipine (NORVASC)  10 MG tablet Take 10 mg by mouth daily.     Colchicine 0.6 MG CAPS Take 1 capsule by mouth 2 (two) times a day.     ELIQUIS 5 MG TABS tablet Take 5 mg by mouth 2 (two) times daily.     eplerenone (INSPRA) 50 MG tablet Take 50 mg by mouth daily.     losartan (COZAAR) 100 MG tablet Take 100 mg by mouth daily.     metoprolol succinate (TOPROL-XL) 100 MG 24 hr tablet Take 100 mg by mouth daily.     rosuvastatin (CRESTOR) 20 MG tablet Take 20 mg by mouth daily.     albuterol (PROVENTIL) (2.5 MG/3ML) 0.083% nebulizer solution Take 2.5 mg by nebulization every 6 (six) hours as needed for wheezing or shortness of breath. (Patient not taking: Reported on 08/24/2022)     aspirin EC 325 MG tablet Take 162.5 mg by mouth daily.  (Patient not taking: Reported  on 08/24/2022)     Tiotropium Bromide Monohydrate (SPIRIVA RESPIMAT) 2.5 MCG/ACT AERS 2 puffs each am (Patient not taking: Reported on 08/24/2022) 1 each 11   No current facility-administered medications for this visit.    Allergies as of 01/31/2023 - Review Complete 01/31/2023  Allergen Reaction Noted   Effexor [venlafaxine hcl] Hives 06/24/2012    Family History  Problem Relation Age of Onset   Huntington's disease Mother    Heart disease Other    Diabetes Other     Social History   Socioeconomic History   Marital status: Married    Spouse name: Not on file   Number of children: Not on file   Years of education: Not on file   Highest education level: Not on file  Occupational History   Not on file  Tobacco Use   Smoking status: Every Day    Current packs/day: 0.50    Average packs/day: 0.5 packs/day for 15.0 years (7.5 ttl pk-yrs)    Types: Cigarettes   Smokeless tobacco: Never   Tobacco comments:    smokes 1/2 pack per day 10/30/20  Vaping Use   Vaping status: Former  Substance and Sexual Activity   Alcohol use: Not Currently    Comment: rarely   Drug use: Yes    Types: Marijuana   Sexual activity: Not on file  Other Topics Concern   Not on file  Social History Narrative   Not on file   Social Determinants of Health   Financial Resource Strain: Not on file  Food Insecurity: No Food Insecurity (05/06/2020)   Received from [facility], [facility]   Hunger Vital Sign    Worried About Running Out of Food in the Last Year: Never true    Ran Out of Food in the Last Year: Never true  Transportation Needs: No Transportation Needs (05/06/2020)   Received from [facility], [facility]   PRAPARE - Transportation    Lack of Transportation (Medical): No    Lack of Transportation (Non-Medical): No  Physical Activity: Not on file  Stress: Not on file  Social Connections: Not on file    Review of systems General: negative for malaise, night sweats,  fever, chills, weight loss Neck: Negative for lumps, goiter, pain and significant neck swelling Resp: Negative for cough, wheezing, dyspnea at rest CV: Negative for chest pain, leg swelling, palpitations, orthopnea GI: denies melena, nausea, vomiting, constipation, dysphagia, odyonophagia, early satiety or unintentional weight loss. +rectal bleeding +loose stools/diarrhea  MSK: Negative for joint pain  or swelling, back pain, and muscle pain. Derm: Negative for itching or rash Psych: Denies depression, anxiety, memory loss, confusion. No homicidal or suicidal ideation.  Heme: Negative for prolonged bleeding, bruising easily, and swollen nodes. Endocrine: Negative for cold or heat intolerance, polyuria, polydipsia and goiter. Neuro: negative for tremor, gait imbalance, syncope and seizures. The remainder of the review of systems is noncontributory.  Physical Exam: BP 130/85 (BP Location: Left Arm, Patient Position: Sitting, Cuff Size: Large)   Pulse (!) 56   Temp 97.8 F (36.6 C) (Oral)   Ht 6' (1.829 m)   Wt (!) 396 lb 3.2 oz (179.7 kg)   BMI 53.73 kg/m  General:   Alert and oriented. No distress noted. Pleasant and cooperative.  Head:  Normocephalic and atraumatic. Eyes:  Conjuctiva clear without scleral icterus. Mouth:  Oral mucosa pink and moist. Good dentition. No lesions. Heart: Normal rate and rhythm, s1 and s2 heart sounds present.  Lungs: Clear lung sounds in all lobes. Respirations equal and unlabored. Abdomen:  +BS, soft, non-tender and non-distended. No rebound or guarding. No HSM or masses noted. Derm: No palmar erythema or jaundice Msk:  Symmetrical without gross deformities. Normal posture. Extremities:  Without edema. Neurologic:  Alert and  oriented x4 Psych:  Alert and cooperative. Normal mood and affect.  Invalid input(s): "6 MONTHS"   ASSESSMENT: Darrell Lindsey is a 52 y.o. male presenting today as a new patient for rectal bleeding/hemorrhoids  Patient with  rectal bleeding, worse recently, notes history of hemorrhoids for the past 2 years though topical therapy has not helped much in the past. He is unsure of last colonoscopy timing though thinks atleast 5 years. Has heavier volume BRBPR at times. No abdominal pain, nausea, vomiting, weight loss or change in bowel habits. He does provide photos of his hemorrhoid today during the visit. Recommend proceeding with colonoscopy to rule out any other underlying causes of heavier rectal bleeding. Patient is on eliquis and will need hematology/cards clearance given history of PE/DVT. He also inquired about hemorrhoid banding, I had a discussion with him regarding indications, risks and benefits of banding, as well as increased risks of bleeding while on ACs, though while not a true contraindication, would need to discuss further with Dr. Levon Hedger prior to scheduling banding, after his colonoscopy. Indications, risks and benefits of procedure discussed in detail with patient. Patient verbalized understanding and is in agreement to proceed with Colonoscopy. Will send anusol cream TID in the meantime.    PLAN:  Schedule colonoscopy-ASA III, (need clearance with Cards/Dr. Theadora Rama with [facility] hematology) 2. Continue to keep rectal area, clean, dry and avoid over wiping  3. Anusol TID 4. Consider hemorrhoid banding, will need to discuss with Dr. Salena Saner given on eliquis  All questions were answered, patient verbalized understanding and is in agreement with plan as outlined above.    Follow Up: 4 months   Rasheed Welty L. Jeanmarie Hubert, MSN, APRN, AGNP-C Adult-Gerontology Nurse Practitioner [facility]  I have reviewed the note and agree with the APP's assessment as described in this progress note  Katrinka Blazing, MD Gastroenterology and Hepatology [facility]

## 2023-01-31 NOTE — Telephone Encounter (Signed)
Per [employer] they have Proctofoam in stock and the medicaid will cover this. Per [place], no to the Proctofoam. We can send the Anusol into [facility] and have him pay $21.00 or we can send the Compounded cream [facility] which will be $50.00. Per patient he will just pay the $6.00 that [facility] told him the Anusol would cost. He does not want [place] to send to [facility] for $ 21.00 nor does he want [place] to send in the Compounded cream to [place] for $50.00.

## 2023-01-31 NOTE — Telephone Encounter (Signed)
Rectal cream anusol hc 2.5 % not covered by patient insurance per [employer].

## 2023-01-31 NOTE — Patient Instructions (Signed)
I have sent anusol cream to use three times per day x10 days then as needed thereafter for your hemorrhoids We will get you scheduled for colonoscopy once we have clearance to hold your eliquis We can discuss hemorrhoid banding further, though will need to consider increased risk of bleeding being on a blood thinner Continue to avoid harsh wiping with toilet tissue as you are doing  Follow up 4 months

## 2023-02-02 ENCOUNTER — Telehealth (INDEPENDENT_AMBULATORY_CARE_PROVIDER_SITE_OTHER): Payer: Self-pay | Admitting: *Deleted

## 2023-02-02 NOTE — Telephone Encounter (Signed)
Faxed request back to pharmacy stating pt said he was going to pay out of pocket for med

## 2023-02-02 NOTE — Telephone Encounter (Signed)
Fax from [facility]. Anusol hc 2.5% is not covered by insurance. They have proctofoam hc in stock today and medicaid will cover. Okay to change?

## 2023-02-03 ENCOUNTER — Telehealth (INDEPENDENT_AMBULATORY_CARE_PROVIDER_SITE_OTHER): Payer: Self-pay | Admitting: Gastroenterology

## 2023-02-03 DIAGNOSIS — I2510 Atherosclerotic heart disease of native coronary artery without angina pectoris: Secondary | ICD-10-CM | POA: Insufficient documentation

## 2023-02-03 DIAGNOSIS — I82402 Acute embolism and thrombosis of unspecified deep veins of left lower extremity: Secondary | ICD-10-CM | POA: Insufficient documentation

## 2023-02-03 NOTE — Telephone Encounter (Signed)
    02/03/23  Darrell Lindsey 11/17/1970  What type of surgery is being performed? Colonoscopy  When is surgery scheduled? TBD  What type of clearance is required (medical or pharmacy to hold medication or both? Medical   Are there any medications that need to be held prior to surgery and how long? Clearance to hold Eliquis for 2 days prior.   Name of physician performing surgery?  Dr. Katrinka Blazing [facility] at [facility] Phone: [phone] Fax: [phone]  Anethesia type (none, local, MAC, general)? MAC

## 2023-02-03 NOTE — Telephone Encounter (Signed)
Patient with diagnosis of A Fib/DVT/PE on Eliquis for anticoagulation.    Procedure: colonoscopy Date of procedure: TBD   CHA2DS2-VASc Score = 3  This indicates a 3.2% annual risk of stroke. The patient's score is based upon: CHF History: 1 HTN History: 1 Diabetes History: 0 Stroke History: 0 Vascular Disease History: 1 Age Score: 0 Gender Score: 0  CrCl 148 ml min using adj body weight Platelet count 231K  Per office protocol, patient can hold Eliquis for 2 days prior to procedure.    **This guidance is not considered finalized until pre-operative APP has relayed final recommendations.**

## 2023-02-03 NOTE — Telephone Encounter (Signed)
    Primary Cardiologist:None  Chart reviewed as part of pre-operative protocol coverage. Because of Yehya Zile [place]'s past medical history and time since last visit, he/she will require a follow-up visit in order to better assess preoperative cardiovascular risk.  Pre-op covering staff: - Please schedule Office appointment and call patient to inform them. - Please contact requesting surgeon's office via preferred method (i.e, phone, fax) to inform them of need for appointment prior to surgery.  Patient has not been seen by cardiology.  If applicable, this message will also be routed to pharmacy pool and/or primary cardiologist for input on holding anticoagulant/antiplatelet agent as requested below so that this information is available at time of patient's appointment.   Ronney Asters, NP  02/03/2023, 3:33 PM

## 2023-02-03 NOTE — Telephone Encounter (Signed)
I will confirm with pre op APP though I do not see that the pt follows our practice.     [facility] [place]

## 2023-02-16 ENCOUNTER — Telehealth (INDEPENDENT_AMBULATORY_CARE_PROVIDER_SITE_OTHER): Payer: Self-pay | Admitting: Gastroenterology

## 2023-02-16 MED ORDER — PEG 3350-KCL-NA BICARB-NACL 420 G PO SOLR: 4000.0000 mL | Freq: Once | ORAL | 0 refills | Status: AC

## 2023-02-16 NOTE — Telephone Encounter (Signed)
Called pt insurance and no PA needed as long as provider is in network

## 2023-02-16 NOTE — Telephone Encounter (Signed)
Cardiac and medication clearance received. Pt contacted and scheduled for 03/08/23 at 7:30am. Will mail instructions. Pt states he is not take aspirin any more. Will call pt with pre op appt. Prep sent to pharmacy

## 2023-02-23 ENCOUNTER — Telehealth (INDEPENDENT_AMBULATORY_CARE_PROVIDER_SITE_OTHER): Payer: Self-pay | Admitting: Gastroenterology

## 2023-02-23 NOTE — Telephone Encounter (Signed)
Pt contacted with pre op appt.  (Pre op 03/04/23 @ 1:45pm)

## 2023-03-03 ENCOUNTER — Encounter: Payer: Self-pay | Admitting: Emergency Medicine

## 2023-03-03 ENCOUNTER — Other Ambulatory Visit: Payer: Self-pay

## 2023-03-03 ENCOUNTER — Ambulatory Visit
Admission: EM | Admit: 2023-03-03 | Discharge: 2023-03-03 | Disposition: A | Discharge status: Home / Self Care | Payer: Medicaid Other | Attending: Family Medicine | Admitting: Family Medicine

## 2023-03-03 DIAGNOSIS — L02415 Cutaneous abscess of right lower limb: Secondary | ICD-10-CM | POA: Diagnosis not present

## 2023-03-03 MED ORDER — DOXYCYCLINE HYCLATE 100 MG PO CAPS: 100.0000 mg | ORAL_CAPSULE | Freq: Two times a day (BID) | ORAL | 0 refills | Status: AC

## 2023-03-03 MED ORDER — MUPIROCIN 2 % EX OINT: 1.0000 | TOPICAL_OINTMENT | Freq: Two times a day (BID) | CUTANEOUS | 0 refills | Status: AC

## 2023-03-03 MED ORDER — HYDROCODONE-ACETAMINOPHEN 5-325 MG PO TABS: 1.0000 | ORAL_TABLET | Freq: Three times a day (TID) | ORAL | 0 refills | Status: AC | PRN

## 2023-03-03 MED ORDER — CHLORHEXIDINE GLUCONATE 4 % EX SOLN: Freq: Every day | CUTANEOUS | 0 refills | Status: AC | PRN

## 2023-03-03 NOTE — Patient Instructions (Signed)
Darrell Lindsey  03/03/2023     @PREFPERIOPPHARMACY @   Your procedure is scheduled on 03/08/23.  Report to [facility] at 0600 A.M.  Call this number if you have problems the morning of surgery:  [phone]  If you experience any cold or flu symptoms such as cough, fever, chills, shortness of breath, etc. between now and your scheduled surgery, please notify us at the above number.   Remember:  Do not eat or drink after midnight.      Take these medicines the morning of surgery with A SIP OF WATER norvasc, colchinine, inspra & metorolol.  Use your albuterol & spiriva inhalers prior to coming to the hospital & bring them with you.    Do not wear jewelry, make-up or nail polish, including gel polish,  artificial nails, or any other type of covering on natural nails (fingers and  toes).  Do not wear lotions, powders, or perfumes, or deodorant.  Do not shave 48 hours prior to surgery.  Men may shave face and neck.  Do not bring valuables to the hospital.  [facility] is not responsible for any belongings or valuables.  Contacts, dentures or bridgework may not be worn into surgery.  Leave your suitcase in the car.  After surgery it may be brought to your room.  For patients admitted to the hospital, discharge time will be determined by your treatment team.  Patients discharged the day of surgery will not be allowed to drive home.   Name and phone number of your driver:   family Special instructions:  FOLLOW DIET & PREP INSTRUCTIONS AS PROVIDED TO YOU FROM THE DOCTOR'S OFFICE  Please read over the following fact sheets that you were given. Anesthesia Post-op Instructions      PATIENT INSTRUCTIONS POST-ANESTHESIA  IMMEDIATELY FOLLOWING SURGERY:  Do not drive or operate machinery for the first twenty four hours after surgery.  Do not make any important decisions for twenty four hours after surgery or while taking narcotic pain medications or sedatives.  If you develop  intractable nausea and vomiting or a severe headache please notify your doctor immediately.  FOLLOW-UP:  Please make an appointment with your surgeon as instructed. You do not need to follow up with anesthesia unless specifically instructed to do so.  WOUND CARE INSTRUCTIONS (if applicable):  Keep a dry clean dressing on the anesthesia/puncture wound site if there is drainage.  Once the wound has quit draining you may leave it open to air.  Generally you should leave the bandage intact for twenty four hours unless there is drainage.  If the epidural site drains for more than 36-48 hours please call the anesthesia department.  QUESTIONS?:  Please feel free to call your physician or the hospital operator if you have any questions, and they will be happy to assist you.      Colonoscopy, Adult A colonoscopy is a procedure to look at the entire large intestine. This procedure is done using a long, thin, flexible tube that has a camera on the end. You may have a colonoscopy: As a part of normal colorectal screening. If you have certain symptoms, such as: A low number of red blood cells in your blood (anemia). Diarrhea that does not go away. Pain in your abdomen. Blood in your stool. A colonoscopy can help screen for and diagnose medical problems, including: An abnormal growth of cells or tissue (tumor). Abnormal growths within the lining of your intestine (polyps). Inflammation. Areas of bleeding. Tell  your health care provider about: Any allergies you have. All medicines you are taking, including vitamins, herbs, eye drops, creams, and over-the-counter medicines. Any problems you or family members have had with anesthetic medicines. Any bleeding problems you have. Any surgeries you have had. Any medical conditions you have. Any problems you have had with having bowel movements. Whether you are pregnant or may be pregnant. What are the risks? Generally, this is a safe procedure. However,  problems may occur, including: Bleeding. Damage to your intestine. Allergic reactions to medicines given during the procedure. Infection. This is rare. What happens before the procedure? Eating and drinking restrictions Follow instructions from your health care provider about eating or drinking restrictions, which may include: A few days before the procedure: Follow a low-fiber diet. Avoid nuts, seeds, dried fruit, raw fruits, and vegetables. 1-3 days before the procedure: Eat only gelatin dessert or ice pops. Drink only clear liquids, such as water, clear juice, clear broth or bouillon, black coffee or tea, or clear soft drinks or sports drinks. Avoid liquids that contain red or purple dye. The day of the procedure: Do not eat solid foods. You may continue to drink clear liquids until up to 2 hours before the procedure. Do not eat or drink anything starting 2 hours before the procedure, or within the time period that your health care provider recommends. Bowel prep If you were prescribed a bowel prep to take by mouth (orally) to clean out your colon: Take it as told by your health care provider. Starting the day before your procedure, you will need to drink a large amount of liquid medicine. The liquid will cause you to have many bowel movements of loose stool until your stool becomes almost clear or light green. If your skin or the opening between the buttocks (anus) gets irritated from diarrhea, you may relieve the irritation using: Wipes with medicine in them, such as adult wet wipes with aloe and vitamin E. A product to soothe skin, such as petroleum jelly. If you vomit while drinking the bowel prep: Take a break for up to 60 minutes. Begin the bowel prep again. Call your health care provider if you keep vomiting or you cannot take the bowel prep without vomiting. To clean out your colon, you may also be given: Laxative medicines. These help you have a bowel movement. Instructions  for enema use. An enema is liquid medicine injected into your rectum. Medicines Ask your health care provider about: Changing or stopping your regular medicines or supplements. This is especially important if you are taking iron supplements, diabetes medicines, or blood thinners. Taking medicines such as aspirin and ibuprofen. These medicines can thin your blood. Do not take these medicines unless your health care provider tells you to take them. Taking over-the-counter medicines, vitamins, herbs, and supplements. General instructions Ask your health care provider what steps will be taken to help prevent infection. These may include washing skin with a germ-killing soap. If you will be going home right after the procedure, plan to have a responsible adult: Take you home from the hospital or clinic. You will not be allowed to drive. Care for you for the time you are told. What happens during the procedure?  An IV will be inserted into one of your veins. You will be given a medicine to make you fall asleep (general anesthetic). You will lie on your side with your knees bent. A lubricant will be put on the tube. Then the tube will be: Inserted  into your anus. Gently eased through all parts of your large intestine. Air will be sent into your colon to keep it open. This may cause some pressure or cramping. Images will be taken with the camera and will appear on a screen. A small tissue sample may be removed to be looked at under a microscope (biopsy). The tissue may be sent to a lab for testing if any signs of problems are found. If small polyps are found, they may be removed and checked for cancer cells. When the procedure is finished, the tube will be removed. The procedure may vary among health care providers and hospitals. What happens after the procedure? Your blood pressure, heart rate, breathing rate, and blood oxygen level will be monitored until you leave the hospital or clinic. You  may have a small amount of blood in your stool. You may pass gas and have mild cramping or bloating in your abdomen. This is caused by the air that was used to open your colon during the exam. If you were given a sedative during the procedure, it can affect you for several hours. Do not drive or operate machinery until your health care provider says that it is safe. It is up to you to get the results of your procedure. Ask your health care provider, or the department that is doing the procedure, when your results will be ready. Summary A colonoscopy is a procedure to look at the entire large intestine. Follow instructions from your health care provider about eating and drinking before the procedure. If you were prescribed an oral bowel prep to clean out your colon, take it as told by your health care provider. During the colonoscopy, a flexible tube with a camera on its end is inserted into the anus and then passed into all parts of the large intestine. This information is not intended to replace advice given to you by your health care provider. Make sure you discuss any questions you have with your health care provider. Document Revised: 07/20/2022 Document Reviewed: 01/28/2021 Elsevier Patient Education  2024 [employer] Colonoscopy, Adult, Care After The following information offers guidance on how to care for yourself after your procedure. Your health care provider may also give you more specific instructions. If you have problems or questions, contact your health care provider. What can I expect after the procedure? After the procedure, it is common to have: A small amount of blood in your stool for 24 hours after the procedure. Some gas. Mild cramping or bloating of your abdomen. Follow these instructions at home: Eating and drinking  Drink enough fluid to keep your urine pale yellow. Follow instructions from your health care provider about eating or drinking restrictions. Resume  your normal diet as told by your health care provider. Avoid heavy or fried foods that are hard to digest. Activity Rest as told by your health care provider. Avoid sitting for a long time without moving. Get up to take short walks every 1-2 hours. This is important to improve blood flow and breathing. Ask for help if you feel weak or unsteady. Return to your normal activities as told by your health care provider. Ask your health care provider what activities are safe for you. Managing cramping and bloating  Try walking around when you have cramps or feel bloated. If directed, apply heat to your abdomen as told by your health care provider. Use the heat source that your health care provider recommends, such as a moist heat pack or  a heating pad. Place a towel between your skin and the heat source. Leave the heat on for 20-30 minutes. Remove the heat if your skin turns bright red. This is especially important if you are unable to feel pain, heat, or cold. You have a greater risk of getting burned. General instructions If you were given a sedative during the procedure, it can affect you for several hours. Do not drive or operate machinery until your health care provider says that it is safe. For the first 24 hours after the procedure: Do not sign important documents. Do not drink alcohol. Do your regular daily activities at a slower pace than normal. Eat soft foods that are easy to digest. Take over-the-counter and prescription medicines only as told by your health care provider. Keep all follow-up visits. This is important. Contact a health care provider if: You have blood in your stool 2-3 days after the procedure. Get help right away if: You have more than a small spotting of blood in your stool. You have large blood clots in your stool. You have swelling of your abdomen. You have nausea or vomiting. You have a fever. You have increasing pain in your abdomen that is not relieved with  medicine. These symptoms may be an emergency. Get help right away. Call 911. Do not wait to see if the symptoms will go away. Do not drive yourself to the hospital. Summary After the procedure, it is common to have a small amount of blood in your stool. You may also have mild cramping and bloating of your abdomen. If you were given a sedative during the procedure, it can affect you for several hours. Do not drive or operate machinery until your health care provider says that it is safe. Get help right away if you have a lot of blood in your stool, nausea or vomiting, a fever, or increased pain in your abdomen. This information is not intended to replace advice given to you by your health care provider. Make sure you discuss any questions you have with your health care provider. Document Revised: 07/20/2022 Document Reviewed: 01/28/2021 Elsevier Patient Education  2024 [employer].

## 2023-03-03 NOTE — ED Triage Notes (Signed)
Pt reports possible abscess to right inner thigh for last few days. Pt reports increase in pain and size noted today. Denies any known fever.

## 2023-03-03 NOTE — ED Provider Notes (Signed)
[facility]    CSN: [id number] Arrival date & time: 03/03/23  1317      History   Chief Complaint Chief Complaint  Patient presents with   Skin Problem    HPI Darrell Lindsey is a 51 y.o. male.   Presenting today with a painful progressively worsening abscess to the right inner thigh for the last few days to a week.  Notes that significantly worsened within the past 24 hours.  Denies drainage, fever, body aches, chills, sweats.  So far not tried anything for symptoms.  States he does have a history of similar.  Of note, on Eliquis for chronic anticoagulation for atrial fibrillation and history of DVT, PE.    Past Medical History:  Diagnosis Date   Anxiety    Asthma    Atrial flutter (HCC) 2012   Tx with ablation   CHF (congestive heart failure) (HCC)    Chronic back pain    COPD (chronic obstructive pulmonary disease) (HCC)    DOE (dyspnea on exertion)    chronic   Gout    Lumbar radiculopathy    OSA on CPAP    Renal disorder    kidney stones   Superficial thrombosis of leg     Patient Active Problem List   Diagnosis Date Noted   Atherosclerotic heart disease of native coronary artery without angina pectoris 02/03/2023   Deep vein thrombosis (DVT) of left lower extremity (HCC) 02/03/2023   Grade IV hemorrhoids 01/31/2023   Rectal bleeding 01/31/2023   Cervical spondylosis without myelopathy 09/07/2022   Degeneration of lumbosacral intervertebral disc 09/07/2022   Pulmonary embolism (HCC) 09/07/2022   Spinal stenosis of lumbar region 09/07/2022   Flank pain 10/09/2020   Essential hypertension 10/07/2020   COPD GOLD ?  / active smoker  10/07/2020   OSA on CPAP 10/07/2020   Cigarette smoker 10/07/2020   Tobacco abuse 05/05/2020   Morbid obesity with body mass index (BMI) of 50.0 to 59.9 in adult [facility]) 08/29/2019   Gout 10/23/2016   Calculus of kidney 08/18/2016   Chronic kidney disease (CKD), stage IV (severe) (HCC) 08/18/2016   Idiopathic  chronic gout of multiple sites without tophus 08/18/2016   Acute kidney injury superimposed on chronic kidney disease (HCC) 06/02/2016   Acute on chronic diastolic (congestive) heart failure (HCC) 06/02/2016   Atrial fibrillation (HCC) 02/26/2016   Shortness of breath 02/26/2016    Past Surgical History:  Procedure Laterality Date   ADENOIDECTOMY     cardaic ablasion     LITHOTRIPSY     removal of salivary gland          Home Medications    Prior to Admission medications   Medication Sig Start Date End Date Taking? Authorizing Provider  chlorhexidine (HIBICLENS) 4 % external liquid Apply topically daily as needed. 03/03/23  Yes Particia Nearing, PA-C  doxycycline (VIBRAMYCIN) 100 MG capsule Take 1 capsule (100 mg total) by mouth 2 (two) times daily. 03/03/23  Yes Particia Nearing, PA-C  HYDROcodone-acetaminophen (NORCO/VICODIN) 5-325 MG tablet Take 1 tablet by mouth 3 (three) times daily as needed for moderate pain. 03/03/23  Yes Particia Nearing, PA-C  mupirocin ointment (BACTROBAN) 2 % Apply 1 Application topically 2 (two) times daily. 03/03/23  Yes Particia Nearing, PA-C  albuterol [facility]) [phone] Base) MCG/ACT inhaler 2 puffs every 4 hours as needed only  if your can't catch your breath 10/07/20   Nyoka Cowden, MD  albuterol (PROVENTIL) (2.5  MG/3ML) 0.083% nebulizer solution Take 2.5 mg by nebulization every 6 (six) hours as needed for wheezing or shortness of breath. Patient not taking: Reported on 08/24/2022    [provider]  allopurinol (ZYLOPRIM) 300 MG tablet Take 300 mg by mouth 2 (two) times daily.     [provider]  amLODipine (NORVASC) 10 MG tablet Take 10 mg by mouth daily. 05/31/22   [provider]  aspirin EC 325 MG tablet Take 162.5 mg by mouth daily.  Patient not taking: Reported on 08/24/2022    [provider]  Colchicine 0.6 MG CAPS Take 1 capsule by mouth 2 (two) times a day.    [provider]  ELIQUIS 5 MG TABS tablet Take 5 mg by mouth 2 (two) times daily. 05/31/22   [provider]  eplerenone (INSPRA) 50 MG tablet Take 50 mg by mouth daily.    [provider]  hydrocortisone (ANUSOL-HC) 2.5 % rectal cream Place 1 Application rectally 3 (three) times daily. 01/31/23   Carlan, Chelsea L, NP  losartan (COZAAR) 100 MG tablet Take 100 mg by mouth daily.    [provider]  metoprolol succinate (TOPROL-XL) 100 MG 24 hr tablet Take 100 mg by mouth daily.    [provider]  rosuvastatin (CRESTOR) 20 MG tablet Take 20 mg by mouth daily. 07/16/22   [provider]  Tiotropium Bromide Monohydrate (SPIRIVA RESPIMAT) 2.5 MCG/ACT AERS 2 puffs each am Patient not taking: Reported on 08/24/2022 10/07/20   Nyoka Cowden, MD    Family History Family History  Problem Relation Age of Onset   Huntington's disease Mother    Heart disease Other    Diabetes Other     Social History Social History   Tobacco Use   Smoking status: Every Day    Current packs/day: 0.50    Average packs/day: 0.5 packs/day for 15.0 years (7.5 ttl pk-yrs)    Types: Cigarettes   Smokeless tobacco: Never   Tobacco comments:    smokes 1/2 pack per day 10/30/20  Vaping Use   Vaping status: Former  Substance Use Topics   Alcohol use: Not Currently    Comment: rarely   Drug use: Yes    Types: Marijuana     Allergies   Effexor [venlafaxine hcl]   Review of Systems Review of Systems Per HPI  Physical Exam Triage Vital Signs ED Triage Vitals  Encounter Vitals Group     BP 03/03/23 1359 122/82     Systolic BP Percentile --      Diastolic BP Percentile --      Pulse Rate 03/03/23 1359 79     Resp 03/03/23 1359 20     Temp 03/03/23 1359 98.3 F (36.8 C)     Temp Source 03/03/23 1359 Oral     SpO2 03/03/23 1359 96 %     Weight --      Height --      Head Circumference --      Peak Flow --      Pain Score 03/03/23 1358 8     Pain Loc --       Pain Education --      Exclude from Growth Chart --    No data found.  Updated Vital Signs BP 122/82 (BP Location: Right Arm)   Pulse 79   Temp 98.3 F (36.8 C) (Oral)   Resp 20   SpO2 96%   Visual Acuity Right Eye Distance:  Left Eye Distance:   Bilateral Distance:    Right Eye Near:   Left Eye Near:    Bilateral Near:     Physical Exam Vitals and nursing note reviewed.  Constitutional:      Appearance: Normal appearance.  HENT:     Head: Atraumatic.  Eyes:     Extraocular Movements: Extraocular movements intact.     Conjunctiva/sclera: Conjunctivae normal.  Cardiovascular:     Rate and Rhythm: Normal rate.  Pulmonary:     Effort: Pulmonary effort is normal.  Musculoskeletal:        General: Normal range of motion.     Cervical back: Normal range of motion and neck supple.  Skin:    General: Skin is warm.     Comments: 2.5 cm fluctuant abscess to the right inner thigh with surrounding erythema, warmth.  Exquisitely tender to palpation.  No active drainage  Neurological:     General: No focal deficit present.     Mental Status: He is oriented to person, place, and time.     Motor: No weakness.     Gait: Gait normal.  Psychiatric:        Mood and Affect: Mood normal.        Thought Content: Thought content normal.        Judgment: Judgment normal.      UC Treatments / Results  Labs (all labs ordered are listed, but only abnormal results are displayed) Labs Reviewed - No data to display  EKG   Radiology No results found.  Procedures Incision and Drainage  Date/Time: 03/03/2023 3:11 PM  Performed by: Particia Nearing, PA-C Authorized by: Particia Nearing, PA-C   Consent:    Consent obtained:  Verbal   Consent given by:  Patient   Risks, benefits, and alternatives were discussed: yes     Risks discussed:  Bleeding, damage to other organs, incomplete drainage and infection   Alternatives discussed:  Alternative treatment Universal  protocol:    Procedure explained and questions answered to patient or proxy's satisfaction: yes     Relevant documents present and verified: yes     Patient identity confirmed:  Verbally with patient and arm band Location:    Type:  Abscess   Size:  2.5 cm   Location:  Lower extremity   Lower extremity location:  Leg   Leg location:  R upper leg Pre-procedure details:    Skin preparation:  Chlorhexidine with alcohol Sedation:    Sedation type:  None Anesthesia:    Anesthesia method:  Local infiltration   Local anesthetic:  Lidocaine 2% WITH epi Procedure type:    Complexity:  Simple Procedure details:    Ultrasound guidance: no     Needle aspiration: no     Incision types:  Stab incision   Incision depth:  Dermal   Wound management:  Probed and deloculated   Drainage:  Bloody   Drainage amount:  Copious   Wound treatment:  Wound left open   Packing materials:  None Post-procedure details:    Procedure completion:  Tolerated well, no immediate complications  (including critical care time)  Medications Ordered in UC Medications - No data to display  Initial Impression / Assessment and Plan / UC Course  I have reviewed the triage vital signs and the nursing notes.  Pertinent labs & imaging results that were available during my care of the patient were reviewed by me and considered in my medical decision making (see  chart for details).     I&D performed without complication, wound cleaned and dressed and will start on doxycycline, mupirocin and Hibiclens for topical care, small course of hydrocodone given for severe pain.  PDMP reviewed and appropriate.  Return for worsening symptoms and follow-up with PCP next week for recheck.  Final Clinical Impressions(s) / UC Diagnoses   Final diagnoses:  Abscess of right leg   Discharge Instructions   None    ED Prescriptions     Medication Sig Dispense Auth. Provider   doxycycline (VIBRAMYCIN) 100 MG capsule Take 1 capsule  (100 mg total) by mouth 2 (two) times daily. 20 capsule Particia Nearing, PA-C   mupirocin ointment (BACTROBAN) 2 % Apply 1 Application topically 2 (two) times daily. 22 g Particia Nearing, [place]   chlorhexidine (HIBICLENS) 4 % external liquid Apply topically daily as needed. 236 mL Particia Nearing, [place]   HYDROcodone-acetaminophen (NORCO/VICODIN) 5-325 MG tablet Take 1 tablet by mouth 3 (three) times daily as needed for moderate pain. 15 tablet Particia Nearing, [place]      I have reviewed the PDMP during this encounter.   Particia Nearing, [place] 03/03/23 1513

## 2023-03-03 NOTE — ED Notes (Signed)
Dressing applied. Nonadherent pad placed on skin, secured with perforated tape. Pt tolerated well.  Site management and infection prevention education provided. Pt verbalized understanding.

## 2023-03-04 ENCOUNTER — Encounter (HOSPITAL_COMMUNITY)
Admission: RE | Admit: 2023-03-04 | Discharge: 2023-03-04 | Disposition: A | Discharge status: Home / Self Care | Payer: Medicaid Other | Source: Ambulatory Visit | Attending: Gastroenterology | Admitting: Gastroenterology

## 2023-03-04 ENCOUNTER — Encounter (HOSPITAL_COMMUNITY): Payer: Self-pay

## 2023-03-04 VITALS — BP 122/82 | HR 79 | Temp 98.3°F | Resp 18 | Ht 72.0 in | Wt 396.2 lb

## 2023-03-04 DIAGNOSIS — K625 Hemorrhage of anus and rectum: Secondary | ICD-10-CM | POA: Diagnosis not present

## 2023-03-04 DIAGNOSIS — Z01812 Encounter for preprocedural laboratory examination: Secondary | ICD-10-CM | POA: Insufficient documentation

## 2023-03-04 HISTORY — DX: Acute embolism and thrombosis of unspecified deep veins of unspecified lower extremity: I82.409

## 2023-03-04 HISTORY — DX: Personal history of urinary calculi: Z87.442

## 2023-03-04 HISTORY — DX: Cardiac arrhythmia, unspecified: I49.9

## 2023-03-04 HISTORY — DX: Prediabetes: R73.03

## 2023-03-04 LAB — BASIC METABOLIC PANEL
Anion gap: 9 (ref 5–15)
BUN: 20 mg/dL (ref 6–20)
CO2: 23 mmol/L (ref 22–32)
Calcium: 8.5 mg/dL — ABNORMAL LOW (ref 8.9–10.3)
Chloride: 106 mmol/L (ref 98–111)
Creatinine, Ser: 1.29 mg/dL — ABNORMAL HIGH (ref 0.61–1.24)
GFR, Estimated: >60 mL/min (ref >60)
Glucose, Bld: 112 mg/dL — ABNORMAL HIGH (ref 70–99)
Potassium: 3.8 mmol/L (ref 3.5–5.1)
Sodium: 138 mmol/L (ref 135–145)

## 2023-03-08 ENCOUNTER — Ambulatory Visit (HOSPITAL_COMMUNITY)
Admission: RE | Admit: 2023-03-08 | Discharge: 2023-03-08 | Disposition: A | Discharge status: Home / Self Care | Payer: Medicaid Other | Attending: Gastroenterology | Admitting: Gastroenterology

## 2023-03-08 ENCOUNTER — Ambulatory Visit (HOSPITAL_COMMUNITY): Payer: Medicaid Other | Admitting: Anesthesiology

## 2023-03-08 ENCOUNTER — Encounter (HOSPITAL_COMMUNITY)
Admission: RE | Disposition: A | Discharge status: Home / Self Care | Payer: Self-pay | Source: Home / Self Care | Attending: Gastroenterology

## 2023-03-08 ENCOUNTER — Encounter (HOSPITAL_COMMUNITY): Payer: Self-pay | Admitting: Gastroenterology

## 2023-03-08 DIAGNOSIS — Z7901 Long term (current) use of anticoagulants: Secondary | ICD-10-CM | POA: Diagnosis not present

## 2023-03-08 DIAGNOSIS — I5033 Acute on chronic diastolic (congestive) heart failure: Secondary | ICD-10-CM

## 2023-03-08 DIAGNOSIS — J4489 Other specified chronic obstructive pulmonary disease: Secondary | ICD-10-CM | POA: Diagnosis not present

## 2023-03-08 DIAGNOSIS — K648 Other hemorrhoids: Secondary | ICD-10-CM | POA: Insufficient documentation

## 2023-03-08 DIAGNOSIS — K644 Residual hemorrhoidal skin tags: Secondary | ICD-10-CM | POA: Diagnosis not present

## 2023-03-08 DIAGNOSIS — K635 Polyp of colon: Secondary | ICD-10-CM

## 2023-03-08 DIAGNOSIS — I130 Hypertensive heart and chronic kidney disease with heart failure and stage 1 through stage 4 chronic kidney disease, or unspecified chronic kidney disease: Secondary | ICD-10-CM | POA: Diagnosis not present

## 2023-03-08 DIAGNOSIS — I509 Heart failure, unspecified: Secondary | ICD-10-CM | POA: Diagnosis not present

## 2023-03-08 DIAGNOSIS — G8929 Other chronic pain: Secondary | ICD-10-CM | POA: Diagnosis not present

## 2023-03-08 DIAGNOSIS — A630 Anogenital (venereal) warts: Secondary | ICD-10-CM | POA: Diagnosis not present

## 2023-03-08 DIAGNOSIS — N184 Chronic kidney disease, stage 4 (severe): Secondary | ICD-10-CM

## 2023-03-08 DIAGNOSIS — K625 Hemorrhage of anus and rectum: Secondary | ICD-10-CM | POA: Diagnosis present

## 2023-03-08 DIAGNOSIS — G4733 Obstructive sleep apnea (adult) (pediatric): Secondary | ICD-10-CM | POA: Insufficient documentation

## 2023-03-08 DIAGNOSIS — Z86718 Personal history of other venous thrombosis and embolism: Secondary | ICD-10-CM | POA: Diagnosis not present

## 2023-03-08 DIAGNOSIS — R194 Change in bowel habit: Secondary | ICD-10-CM | POA: Diagnosis not present

## 2023-03-08 DIAGNOSIS — D123 Benign neoplasm of transverse colon: Secondary | ICD-10-CM | POA: Diagnosis not present

## 2023-03-08 DIAGNOSIS — F1290 Cannabis use, unspecified, uncomplicated: Secondary | ICD-10-CM | POA: Diagnosis not present

## 2023-03-08 DIAGNOSIS — K5730 Diverticulosis of large intestine without perforation or abscess without bleeding: Secondary | ICD-10-CM | POA: Insufficient documentation

## 2023-03-08 DIAGNOSIS — Z86711 Personal history of pulmonary embolism: Secondary | ICD-10-CM | POA: Insufficient documentation

## 2023-03-08 DIAGNOSIS — F17210 Nicotine dependence, cigarettes, uncomplicated: Secondary | ICD-10-CM | POA: Diagnosis not present

## 2023-03-08 HISTORY — PX: POLYPECTOMY: SHX5525

## 2023-03-08 HISTORY — PX: COLONOSCOPY WITH PROPOFOL: SHX5780

## 2023-03-08 HISTORY — PX: BIOPSY: SHX5522

## 2023-03-08 HISTORY — PX: HEMOSTASIS CLIP PLACEMENT: SHX6857

## 2023-03-08 LAB — GLUCOSE, CAPILLARY: Glucose-Capillary: 100 mg/dL — ABNORMAL HIGH (ref 70–99)

## 2023-03-08 LAB — HM COLONOSCOPY

## 2023-03-08 SURGERY — COLONOSCOPY WITH PROPOFOL
Anesthesia: General

## 2023-03-08 MED ORDER — PROPOFOL 500 MG/50ML IV EMUL
INTRAVENOUS | Status: DC | PRN
  Administered 2023-03-08: 150 ug/kg/min via INTRAVENOUS

## 2023-03-08 MED ORDER — PROPOFOL 10 MG/ML IV BOLUS
INTRAVENOUS | Status: DC | PRN
Start: 2023-03-08 — End: 2023-03-08
  Administered 2023-03-08 (×2): 50 mg via INTRAVENOUS

## 2023-03-08 MED ORDER — LACTATED RINGERS IV SOLN
INTRAVENOUS | Status: DC
  Administered 2023-03-08: 1000 mL via INTRAVENOUS

## 2023-03-08 NOTE — Op Note (Signed)
[facility] Patient Name: Darrell Lindsey Procedure Date: 03/08/2023 7:17 AM MRN: [id number] Date of Birth: 12/06/1970 Attending MD: Katrinka Blazing , , [id number] CSN: [id number] Age: 52 Admit Type: Outpatient Procedure:                Colonoscopy Indications:              Rectal bleeding, Change in bowel habits Providers:                Katrinka Blazing, Nena Polio, RN, Zena Amos Referring MD:              Medicines:                Monitored Anesthesia Care Complications:            No immediate complications. Estimated Blood Loss:     Estimated blood loss: none. Procedure:                Pre-Anesthesia Assessment:                           - Prior to the procedure, a History and Physical                            was performed, and patient medications, allergies                            and sensitivities were reviewed. The patient's                            tolerance of previous anesthesia was reviewed.                           - The risks and benefits of the procedure and the                            sedation options and risks were discussed with the                            patient. All questions were answered and informed                            consent was obtained.                           - ASA Grade Assessment: III - A patient with severe                            systemic disease.                           After obtaining informed consent, the colonoscope                            was passed under direct vision. Throughout the                            procedure, the patient's  blood pressure, pulse, and                            oxygen saturations were monitored continuously. The                            [id number]-[id number] ([id number]) was introduced through the                            anus and advanced to the the cecum, identified by                            appendiceal orifice and ileocecal valve. The                            colonoscopy was  performed without difficulty. The                            patient tolerated the procedure well. The quality                            of the bowel preparation was good. Scope In: 7:52:00 AM Scope Out: 8:18:21 AM Scope Withdrawal Time: 0 hours 24 minutes 14 seconds  Total Procedure Duration: 0 hours 26 minutes 21 seconds  Findings:      Large external hemorrhoids were found on perianal exam. There were two       anal condyloma.      A 5 mm polyp was found in the transverse colon. The polyp was sessile.       The polyp was removed with a cold snare. Resection and retrieval were       complete.      A 15 mm polyp was found in the sigmoid colon. The polyp was       pedunculated. The polyp was removed with a hot snare. Resection and       retrieval were complete. To prevent bleeding after the polypectomy,       three hemostatic clips (one was Ultra) were successfully placed. Clip       manufacturer: [employer]. There was no bleeding at the end of the       procedure.      Scattered large-mouthed and small-mouthed diverticula were found in the       sigmoid colon, descending colon and ascending colon. Biopsies for       histology were taken from the normal colon with a cold forceps from the       right colon and left colon for evaluation of microscopic colitis.      Non-bleeding internal hemorrhoids were found during retroflexion. The       hemorrhoids were small. Impression:               - Large external hemorrhoids found on perianal exam.                           - One 5 mm polyp in the transverse colon, removed  with a cold snare. Resected and retrieved.                           - One 15 mm polyp in the sigmoid colon, removed                            with a hot snare. Resected and retrieved. Clips                            were placed. Clip manufacturer: [employer].                           - Diverticulosis in the sigmoid colon, in the                             descending colon and in the ascending colon. Normal                            colon biopsied.                           - Non-bleeding internal hemorrhoids. Moderate Sedation:      Per Anesthesia Care Recommendation:           - Discharge patient to home (ambulatory).                           - Resume previous diet.                           - Await pathology results.                           - Repeat colonoscopy for surveillance based on                            pathology results.                           - Take Miralax on a regular basis to keep bowel                            movements loose and to avoid straining.                           - If persisting rectal bleedin will need surgical                            evaluation for hemorrhoidectomy. Procedure Code(s):        --- Professional ---                           [phone], Colonoscopy, flexible; with removal of                            tumor(s), polyp(s), or other lesion(s) by snare  technique                           [id number], 59, Colonoscopy, flexible; with biopsy,                            single or multiple Diagnosis Code(s):        --- Professional ---                           D12.3, Benign neoplasm of transverse colon (hepatic                            flexure or splenic flexure)                           D12.5, Benign neoplasm of sigmoid colon                           K64.8, Other hemorrhoids                           K62.5, Hemorrhage of anus and rectum                           R19.4, Change in bowel habit                           K57.30, Diverticulosis of large intestine without                            perforation or abscess without bleeding CPT copyright 2022 [employer]. All rights reserved. The codes documented in this report are preliminary and upon coder review may  be revised to meet current compliance requirements. Katrinka Blazing, MD Katrinka Blazing,  03/08/2023 8:26:42 AM This report has been signed electronically. Number of Addenda: 0

## 2023-03-08 NOTE — Transfer of Care (Signed)
Immediate Anesthesia Transfer of Care Note  Patient: Darrell Lindsey  Procedure(s) Performed: COLONOSCOPY WITH PROPOFOL POLYPECTOMY BIOPSY HEMOSTASIS CLIP PLACEMENT  Patient Location: Short Stay  Anesthesia Type:General  Level of Consciousness: awake, alert , oriented, and patient cooperative  Airway & Oxygen Therapy: Patient Spontanous Breathing  Post-op Assessment: Report given to RN, Post -op Vital signs reviewed and stable, and Patient moving all extremities X 4  Post vital signs: Reviewed and stable  Last Vitals:  Vitals Value Taken Time  BP 93/66   Temp    Pulse 76   Resp 22   SpO2 92     Last Pain:  Vitals:   03/08/23 0654  TempSrc: Oral      Patients Stated Pain Goal: 8 (03/08/23 0654)  Complications: No notable events documented.

## 2023-03-08 NOTE — Anesthesia Preprocedure Evaluation (Signed)
Anesthesia Evaluation  Patient identified by MRN, date of birth, ID band Patient awake    Reviewed: Allergy & Precautions, H&P , NPO status , Patient's Chart, lab work & pertinent test results, reviewed documented beta blocker date and time   Airway Mallampati: II  TM Distance: >3 FB Neck ROM: full    Dental no notable dental hx.    Pulmonary neg pulmonary ROS, shortness of breath, asthma , sleep apnea , COPD, Current Smoker and Patient abstained from smoking.   Pulmonary exam normal breath sounds clear to auscultation       Cardiovascular Exercise Tolerance: Good hypertension, + CAD, +CHF and + DOE  negative cardio ROS + dysrhythmias Atrial Fibrillation  Rhythm:regular Rate:Normal     Neuro/Psych   Anxiety      Neuromuscular disease negative neurological ROS  negative psych ROS   GI/Hepatic negative GI ROS, Neg liver ROS,,,  Endo/Other    Morbid obesity  Renal/GU Renal diseasenegative Renal ROS  negative genitourinary   Musculoskeletal   Abdominal   Peds  Hematology negative hematology ROS (+)   Anesthesia Other Findings   Reproductive/Obstetrics negative OB ROS                             Anesthesia Physical Anesthesia Plan  ASA: 3  Anesthesia Plan: General   Post-op Pain Management:    Induction:   PONV Risk Score and Plan: Propofol infusion  Airway Management Planned:   Additional Equipment:   Intra-op Plan:   Post-operative Plan:   Informed Consent: I have reviewed the patients History and Physical, chart, labs and discussed the procedure including the risks, benefits and alternatives for the proposed anesthesia with the patient or authorized representative who has indicated his/her understanding and acceptance.     Dental Advisory Given  Plan Discussed with: CRNA  Anesthesia Plan Comments:        Anesthesia Quick Evaluation

## 2023-03-08 NOTE — H&P (Signed)
Darrell Lindsey is an 52 y.o. male.   Chief Complaint: rectal bleeding HPI: Darrell Lindsey is a 52 y.o. male with past medical history of anxiety, asthma, A flutter (ablation previously), CHF, COPD, Gout, OSA on CPAP, DVT on eliquis , coming for evaluation of rectal bleeding  Patient reports intermittent bleeding when defecating. Sometiems strains to move his bowels. The patient denies having any nausea, vomiting, fever, chills, , melena, hematemesis, abdominal distention, abdominal pain, diarrhea, jaundice, pruritus or weight loss.  Past Medical History:  Diagnosis Date   Anxiety    Asthma    Atrial flutter (HCC) 2012   Tx with ablation   CHF (congestive heart failure) (HCC)    Chronic back pain    COPD (chronic obstructive pulmonary disease) (HCC)    DOE (dyspnea on exertion)    chronic   DVT (deep venous thrombosis) (HCC)    left leg   Dysrhythmia    Gout    History of kidney stones    Lumbar radiculopathy    OSA on CPAP    Pre-diabetes    Pulmonary embolism (HCC) 05/2022   Renal disorder    kidney stones   Superficial thrombosis of leg     Past Surgical History:  Procedure Laterality Date   ADENOIDECTOMY     cardaic ablasion  2012   CYSTOSCOPY/URETEROSCOPY/HOLMIUM LASER/STENT PLACEMENT     LITHOTRIPSY     LITHOTRIPSY     removal of salivary gland       Family History  Problem Relation Age of Onset   Huntington's disease Mother    Heart disease Other    Diabetes Other    Social History:  reports that he has been smoking cigarettes. He has a 7.5 pack-year smoking history. He has never used smokeless tobacco. He reports that he does not currently use alcohol. He reports current drug use. Drug: Marijuana.  Allergies:  Allergies  Allergen Reactions   Effexor [Venlafaxine Hcl] Hives    Medications Prior to Admission  Medication Sig Dispense Refill   albuterol (PROAIR HFA) 108 (90 Base) MCG/ACT inhaler 2 puffs every 4 hours as needed only  if your can't catch  your breath 18 g 2   albuterol (PROVENTIL) (2.5 MG/3ML) 0.083% nebulizer solution Take 2.5 mg by nebulization every 6 (six) hours as needed for wheezing or shortness of breath.     allopurinol (ZYLOPRIM) 300 MG tablet Take 300 mg by mouth 2 (two) times daily.      amLODipine (NORVASC) 10 MG tablet Take 10 mg by mouth daily.     chlorhexidine (HIBICLENS) 4 % external liquid Apply topically daily as needed. 236 mL 0   Colchicine 0.6 MG CAPS Take 1 capsule by mouth 2 (two) times a day.     doxycycline (VIBRAMYCIN) 100 MG capsule Take 1 capsule (100 mg total) by mouth 2 (two) times daily. 20 capsule 0   eplerenone (INSPRA) 50 MG tablet Take 50 mg by mouth daily.     HYDROcodone-acetaminophen (NORCO/VICODIN) 5-325 MG tablet Take 1 tablet by mouth 3 (three) times daily as needed for moderate pain. 15 tablet 0   hydrocortisone (ANUSOL-HC) 2.5 % rectal cream Place 1 Application rectally 3 (three) times daily. 56 g 1   losartan (COZAAR) 100 MG tablet Take 100 mg by mouth daily.     metoprolol succinate (TOPROL-XL) 100 MG 24 hr tablet Take 100 mg by mouth daily.     mupirocin cream (BACTROBAN) 2 % Apply 1 Application topically 2 (two)  times daily.     mupirocin ointment (BACTROBAN) 2 % Apply 1 Application topically 2 (two) times daily. 22 g 0   rosuvastatin (CRESTOR) 20 MG tablet Take 20 mg by mouth daily.     Tiotropium Bromide Monohydrate (SPIRIVA RESPIMAT) 2.5 MCG/ACT AERS 2 puffs each am 1 each 11   aspirin EC 325 MG tablet Take 162.5 mg by mouth daily.  (Patient not taking: Reported on 08/24/2022)     doxycycline (ADOXA) 100 MG tablet Take 100 mg by mouth 2 (two) times daily.     ELIQUIS 5 MG TABS tablet Take 5 mg by mouth 2 (two) times daily.     Semaglutide-Weight Management (WEGOVY) 0.5 MG/0.5ML SOAJ Inject 0.5 mg into the skin.      Results for orders placed or performed during the hospital encounter of 03/08/23 (from the past 48 hour(s))  Glucose, capillary     Status: Abnormal   Collection  Time: 03/08/23  7:17 AM  Result Value Ref Range   Glucose-Capillary 100 (H) 70 - 99 mg/dL    Comment: Glucose reference range applies only to samples taken after fasting for at least 8 hours.   No results found.  Review of Systems  Gastrointestinal:  Positive for blood in stool and constipation.  All other systems reviewed and are negative.   Blood pressure (!) 156/80, temperature 97.9 F (36.6 C), temperature source Oral, resp. rate 18, SpO2 98%. Physical Exam  GENERAL: The patient is AO x3, in no acute distress. HEENT: Head is normocephalic and atraumatic. EOMI are intact. Mouth is well hydrated and without lesions. NECK: Supple. No masses LUNGS: Clear to auscultation. No presence of rhonchi/wheezing/rales. Adequate chest expansion HEART: RRR, normal s1 and s2. ABDOMEN: Soft, nontender, no guarding, no peritoneal signs, and nondistended. BS +. No masses. EXTREMITIES: Without any cyanosis, clubbing, rash, lesions or edema. NEUROLOGIC: AOx3, no focal motor deficit. SKIN: no jaundice, no rashes  Assessment/Plan  Darrell Lindsey is a 52 y.o. male with past medical history of anxiety, asthma, A flutter (ablation previously), CHF, COPD, Gout, OSA on CPAP, DVT on eliquis , coming for evaluation of rectal bleeding. We will proceed with colonoscopy.  Dolores Frame, MD 03/08/2023, 7:41 AM

## 2023-03-08 NOTE — Discharge Instructions (Signed)
You are being discharged to home.  Resume your previous diet.  We are waiting for your pathology results.  Your physician has recommended a repeat colonoscopy for surveillance based on pathology results.  Take Miralax on a regular basis to keep bowel movements loose and to avoid straining. If persisting rectal bleedin will need surgical evaluation for hemorrhoidectomy.

## 2023-03-09 ENCOUNTER — Encounter (INDEPENDENT_AMBULATORY_CARE_PROVIDER_SITE_OTHER): Payer: Self-pay | Admitting: *Deleted

## 2023-03-10 ENCOUNTER — Encounter (INDEPENDENT_AMBULATORY_CARE_PROVIDER_SITE_OTHER): Payer: Self-pay | Admitting: *Deleted

## 2023-03-15 NOTE — Anesthesia Postprocedure Evaluation (Signed)
Anesthesia Post Note  Patient: Darrell Lindsey  Procedure(s) Performed: COLONOSCOPY WITH PROPOFOL POLYPECTOMY BIOPSY HEMOSTASIS CLIP PLACEMENT  Patient location during evaluation: Phase II Anesthesia Type: General Level of consciousness: awake Pain management: pain level controlled Vital Signs Assessment: post-procedure vital signs reviewed and stable Respiratory status: spontaneous breathing and respiratory function stable Cardiovascular status: blood pressure returned to baseline and stable Postop Assessment: no headache and no apparent nausea or vomiting Anesthetic complications: no Comments: Late entry   No notable events documented.   Last Vitals:  Vitals:   03/08/23 0826 03/08/23 0828  BP: (!) 92/53 138/74  Pulse: 73 73  Resp: 18 19  Temp: 36.6 C   SpO2: 91% 93%    Last Pain:  Vitals:   03/09/23 1432  TempSrc:   PainSc: 0-No pain                 Windell Norfolk

## 2023-03-17 ENCOUNTER — Encounter (HOSPITAL_COMMUNITY): Payer: Self-pay | Admitting: Gastroenterology

## 2023-06-02 ENCOUNTER — Ambulatory Visit (INDEPENDENT_AMBULATORY_CARE_PROVIDER_SITE_OTHER): Payer: Medicaid Other | Admitting: Gastroenterology

## 2023-10-17 ENCOUNTER — Ambulatory Visit: Admission: EM | Admit: 2023-10-17 | Discharge: 2023-10-17 | Disposition: A | Discharge status: Home / Self Care

## 2023-10-17 DIAGNOSIS — L02419 Cutaneous abscess of limb, unspecified: Secondary | ICD-10-CM

## 2023-10-17 DIAGNOSIS — L03119 Cellulitis of unspecified part of limb: Secondary | ICD-10-CM

## 2023-10-17 MED ORDER — MUPIROCIN 2 % EX OINT: 1.0000 | TOPICAL_OINTMENT | Freq: Two times a day (BID) | CUTANEOUS | 0 refills | Status: AC

## 2023-10-17 MED ORDER — DOXYCYCLINE HYCLATE 100 MG PO CAPS: 100.0000 mg | ORAL_CAPSULE | Freq: Two times a day (BID) | ORAL | 0 refills | Status: AC

## 2023-10-17 NOTE — ED Triage Notes (Signed)
 Pt reports and abscess located on the inner right thigh, x 5 days. Denies any drainage. Pain redness and swelling present.

## 2023-10-21 NOTE — ED Provider Notes (Signed)
 [facility]    CSN: [id number] Arrival date & time: 10/17/23  0855      History   Chief Complaint No chief complaint on file.   HPI Darrell Lindsey is a 53 y.o. male.   Patient presenting today with 5-day history of painful swollen abscess to the right inner thigh.  States the area is becoming larger and more painful over time.  Denies drainage, fever, chills, known injury to the area.  So far trying warm compresses with minimal relief.  History of similar in the past.    Past Medical History:  Diagnosis Date   Anxiety    Asthma    Atrial flutter (HCC) 2012   Tx with ablation   CHF (congestive heart failure) (HCC)    Chronic back pain    COPD (chronic obstructive pulmonary disease) (HCC)    DOE (dyspnea on exertion)    chronic   DVT (deep venous thrombosis) (HCC)    left leg   Dysrhythmia    Gout    History of kidney stones    Lumbar radiculopathy    OSA on CPAP    Pre-diabetes    Pulmonary embolism (HCC) 05/2022   Renal disorder    kidney stones   Superficial thrombosis of leg     Patient Active Problem List   Diagnosis Date Noted   Atherosclerotic heart disease of native coronary artery without angina pectoris 02/03/2023   Deep vein thrombosis (DVT) of left lower extremity (HCC) 02/03/2023   Grade IV hemorrhoids 01/31/2023   Rectal bleeding 01/31/2023   Cervical spondylosis without myelopathy 09/07/2022   Degeneration of lumbosacral intervertebral disc 09/07/2022   Pulmonary embolism (HCC) 09/07/2022   Spinal stenosis of lumbar region 09/07/2022   Flank pain 10/09/2020   Essential hypertension 10/07/2020   COPD GOLD ?  / active smoker  10/07/2020   OSA on CPAP 10/07/2020   Cigarette smoker 10/07/2020   Tobacco abuse 05/05/2020   Morbid obesity with body mass index (BMI) of 50.0 to 59.9 in adult [facility]) 08/29/2019   Gout 10/23/2016   Calculus of kidney 08/18/2016   Chronic kidney disease (CKD), stage IV (severe) (HCC) 08/18/2016    Idiopathic chronic gout of multiple sites without tophus 08/18/2016   Acute kidney injury superimposed on chronic kidney disease (HCC) 06/02/2016   Acute on chronic diastolic (congestive) heart failure (HCC) 06/02/2016   Atrial fibrillation (HCC) 02/26/2016   Shortness of breath 02/26/2016    Past Surgical History:  Procedure Laterality Date   ADENOIDECTOMY     BIOPSY  03/08/2023   Procedure: BIOPSY;  Surgeon: Urban Garden, MD;  Location: [facility];  Service: Gastroenterology;;   cardaic ablasion  2012   COLONOSCOPY WITH PROPOFOL  N/A 03/08/2023   Procedure: COLONOSCOPY WITH PROPOFOL ;  Surgeon: Urban Garden, MD;  Location: [facility];  Service: Gastroenterology;  Laterality: N/A;  7:30AM;ASA 3   CYSTOSCOPY/URETEROSCOPY/HOLMIUM LASER/STENT PLACEMENT     HEMOSTASIS CLIP PLACEMENT  03/08/2023   Procedure: HEMOSTASIS CLIP PLACEMENT;  Surgeon: Urban Garden, MD;  Location: [facility];  Service: Gastroenterology;;   LITHOTRIPSY     LITHOTRIPSY     POLYPECTOMY  03/08/2023   Procedure: POLYPECTOMY;  Surgeon: Urban Garden, MD;  Location: [facility];  Service: Gastroenterology;;   removal of salivary gland          Home Medications    Prior to Admission medications   Medication Sig Start Date End Date Taking? Authorizing Provider  doxycycline  (VIBRAMYCIN ) 100 MG capsule Take 1 capsule (100 mg total) by mouth 2 (two) times daily. 10/17/23  Yes Corbin Dess, PA-C  famotidine (PEPCID) 20 MG tablet Take 20 mg by mouth at bedtime as needed. 10/11/23  Yes [provider]  mupirocin  ointment (BACTROBAN ) 2 % Apply 1 Application topically 2 (two) times daily. 10/17/23  Yes Corbin Dess, PA-C  albuterol  (PROAIR  HFA) 108 (90 Base) MCG/ACT inhaler 2 puffs every 4 hours as needed only  if your can't catch your breath 10/07/20   Diamond Formica, MD  albuterol  (PROVENTIL ) (2.5 MG/3ML) 0.083% nebulizer solution Take 2.5 mg  by nebulization every 6 (six) hours as needed for wheezing or shortness of breath.    [provider]  allopurinol (ZYLOPRIM) 300 MG tablet Take 300 mg by mouth 2 (two) times daily.     [provider]  amLODipine (NORVASC) 10 MG tablet Take 10 mg by mouth daily. 05/31/22   [provider]  aspirin  EC 325 MG tablet Take 162.5 mg by mouth daily.  Patient not taking: Reported on 08/24/2022    [provider]  chlorhexidine  (HIBICLENS ) 4 % external liquid Apply topically daily as needed. 03/03/23   Corbin Dess, PA-C  Colchicine 0.6 MG CAPS Take 1 capsule by mouth 2 (two) times a day.    [provider]  doxycycline  (ADOXA) 100 MG tablet Take 100 mg by mouth 2 (two) times daily.    [provider]  doxycycline  (VIBRAMYCIN ) 100 MG capsule Take 1 capsule (100 mg total) by mouth 2 (two) times daily. 03/03/23   Corbin Dess, PA-C  ELIQUIS 5 MG TABS tablet Take 5 mg by mouth 2 (two) times daily. 05/31/22   [provider]  eplerenone (INSPRA) 50 MG tablet Take 50 mg by mouth daily.    [provider]  HYDROcodone -acetaminophen  (NORCO/VICODIN) 5-325 MG tablet Take 1 tablet by mouth 3 (three) times daily as needed for moderate pain. 03/03/23   Corbin Dess, PA-C  hydrocortisone  (ANUSOL -HC) 2.5 % rectal cream Place 1 Application rectally 3 (three) times daily. 01/31/23   Carlan, Chelsea L, NP  losartan (COZAAR) 100 MG tablet Take 100 mg by mouth daily.    [provider]  metoprolol succinate (TOPROL-XL) 100 MG 24 hr tablet Take 100 mg by mouth daily.    [provider]  mupirocin  cream (BACTROBAN ) 2 % Apply 1 Application topically 2 (two) times daily.    [provider]  mupirocin  ointment (BACTROBAN ) 2 % Apply 1 Application topically 2 (two) times daily. 03/03/23   Corbin Dess, PA-C  rosuvastatin (CRESTOR) 20 MG tablet Take 20 mg by mouth daily. 07/16/22   [provider]  Semaglutide-Weight Management (WEGOVY) 0.5 MG/0.5ML SOAJ Inject 0.5 mg into the skin.    [provider]  Tiotropium Bromide  Monohydrate (SPIRIVA  RESPIMAT) 2.5 MCG/ACT AERS 2 puffs each am 10/07/20   Diamond Formica, MD    Family History Family History  Problem Relation Age of Onset   Huntington's disease Mother    Heart disease Other    Diabetes Other     Social History Social History   Tobacco Use   Smoking status: Every Day    Current packs/day: 0.50    Average packs/day: 0.5 packs/day for 15.0 years (7.5 ttl pk-yrs)    Types: Cigarettes   Smokeless tobacco: Never   Tobacco comments:    smokes 1/2 pack per day 10/30/20  Vaping Use  Vaping status: Former  Substance Use Topics   Alcohol use: Not Currently    Comment: rarely   Drug use: Yes    Types: Marijuana     Allergies   Effexor [venlafaxine hcl]   Review of Systems Review of Systems Per HPI  Physical Exam Triage Vital Signs ED Triage Vitals [10/17/23 0902]  Encounter Vitals Group     BP      Systolic BP Percentile      Diastolic BP Percentile      Pulse      Resp      Temp      Temp src      SpO2      Weight      Height      Head Circumference      Peak Flow      Pain Score 8     Pain Loc      Pain Education      Exclude from Growth Chart    No data found.  Updated Vital Signs There were no vitals taken for this visit.  Visual Acuity Right Eye Distance:   Left Eye Distance:   Bilateral Distance:    Right Eye Near:   Left Eye Near:    Bilateral Near:     Physical Exam Vitals and nursing note reviewed.  Constitutional:      Appearance: Normal appearance.  HENT:     Head: Atraumatic.  Eyes:     Extraocular Movements: Extraocular movements intact.     Conjunctiva/sclera: Conjunctivae normal.  Cardiovascular:     Rate and Rhythm: Normal rate.  Pulmonary:     Effort: Pulmonary effort is normal.  Musculoskeletal:        General: Swelling and tenderness present.  Normal range of motion.     Cervical back: Normal range of motion and neck supple.  Skin:    General: Skin is warm.     Findings: Erythema present.     Comments: Significant area of erythema, edema and fluctuance to right inner thigh, central opening with bleeding and slight drainage present  Neurological:     Mental Status: He is oriented to person, place, and time.     Motor: No weakness.     Gait: Gait normal.     Comments: Right lower extremity neurovascularly intact  Psychiatric:        Mood and Affect: Mood normal.        Thought Content: Thought content normal.        Judgment: Judgment normal.      UC Treatments / Results  Labs (all labs ordered are listed, but only abnormal results are displayed) Labs Reviewed - No data to display  EKG   Radiology No results found.  Procedures Procedures (including critical care time)  Medications Ordered in UC Medications - No data to display  Initial Impression / Assessment and Plan / UC Course  I have reviewed the triage vital signs and the nursing notes.  Pertinent labs & imaging results that were available during my care of the patient were reviewed by me and considered in my medical decision making (see chart for details).     Will forego incision and drainage today as the area is opening naturally.  Treat with doxycycline , mupirocin , warm compresses and supportive over-the-counter medications and home care.  Return for worsening symptoms.  Final Clinical Impressions(s) / UC Diagnoses   Final diagnoses:  Cellulitis and abscess of leg   Discharge Instructions  None    ED Prescriptions     Medication Sig Dispense Auth. Provider   doxycycline  (VIBRAMYCIN ) 100 MG capsule Take 1 capsule (100 mg total) by mouth 2 (two) times daily. 20 capsule Corbin Dess, PA-C   mupirocin  ointment (BACTROBAN ) 2 % Apply 1 Application topically 2 (two) times daily. 60 g Corbin Dess, [place]      PDMP not  reviewed this encounter.   Corbin Dess, [place] 10/21/23 1610

## 2024-04-25 ENCOUNTER — Encounter (INDEPENDENT_AMBULATORY_CARE_PROVIDER_SITE_OTHER): Payer: Self-pay | Admitting: Gastroenterology
